# Patient Record
Sex: Female | Born: 1985 | Race: White | Hispanic: No | Marital: Single | Smoking: Current every day smoker
Health system: Southern US, Community
[De-identification: ages and names within clinical notes are randomized; demographics above are authoritative.]

## PROBLEM LIST (undated history)

## (undated) DIAGNOSIS — Z789 Other specified health status: Secondary | ICD-10-CM

## (undated) HISTORY — PX: DENTAL SURGERY: SHX609

---
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
## 2008-12-01 ENCOUNTER — Ambulatory Visit (HOSPITAL_COMMUNITY): Admission: RE | Admit: 2008-12-01 | Discharge: 2008-12-01 | Payer: Self-pay | Admitting: Family Medicine

## 2009-02-21 ENCOUNTER — Emergency Department (HOSPITAL_COMMUNITY): Admission: EM | Admit: 2009-02-21 | Discharge: 2009-02-21 | Payer: Self-pay | Admitting: Emergency Medicine

## 2010-04-21 LAB — PREGNANCY, URINE: Preg Test, Ur: NEGATIVE

## 2010-10-22 ENCOUNTER — Other Ambulatory Visit: Payer: Self-pay | Admitting: Nurse Practitioner

## 2010-10-22 ENCOUNTER — Other Ambulatory Visit (HOSPITAL_COMMUNITY)
Admission: RE | Admit: 2010-10-22 | Discharge: 2010-10-22 | Disposition: A | Discharge status: Home / Self Care | Payer: Self-pay | Source: Ambulatory Visit | Attending: Unknown Physician Specialty | Admitting: Unknown Physician Specialty

## 2010-10-22 ENCOUNTER — Other Ambulatory Visit (HOSPITAL_COMMUNITY)
Admission: RE | Admit: 2010-10-22 | Discharge: 2010-10-22 | Disposition: A | Discharge status: Home / Self Care | Payer: Self-pay | Source: Ambulatory Visit | Attending: Family Medicine | Admitting: Family Medicine

## 2010-10-22 DIAGNOSIS — N870 Mild cervical dysplasia: Secondary | ICD-10-CM | POA: Insufficient documentation

## 2010-10-22 DIAGNOSIS — R87613 High grade squamous intraepithelial lesion on cytologic smear of cervix (HGSIL): Secondary | ICD-10-CM | POA: Insufficient documentation

## 2010-12-16 ENCOUNTER — Encounter: Payer: Self-pay | Admitting: Emergency Medicine

## 2010-12-16 ENCOUNTER — Emergency Department (HOSPITAL_COMMUNITY): Payer: Self-pay

## 2010-12-16 ENCOUNTER — Emergency Department (HOSPITAL_COMMUNITY)
Admission: EM | Admit: 2010-12-16 | Discharge: 2010-12-16 | Disposition: A | Discharge status: Home / Self Care | Payer: Self-pay | Attending: Emergency Medicine | Admitting: Emergency Medicine

## 2010-12-16 DIAGNOSIS — M25519 Pain in unspecified shoulder: Secondary | ICD-10-CM | POA: Insufficient documentation

## 2010-12-16 DIAGNOSIS — R071 Chest pain on breathing: Secondary | ICD-10-CM | POA: Insufficient documentation

## 2010-12-16 DIAGNOSIS — F17200 Nicotine dependence, unspecified, uncomplicated: Secondary | ICD-10-CM | POA: Insufficient documentation

## 2010-12-16 DIAGNOSIS — R0789 Other chest pain: Secondary | ICD-10-CM

## 2010-12-16 MED ORDER — NAPROXEN 250 MG PO TABS
500.0000 mg | ORAL_TABLET | Freq: Once | ORAL | Status: AC
  Administered 2010-12-16: 500 mg via ORAL
  Filled 2010-12-16: qty 2

## 2010-12-16 NOTE — ED Notes (Signed)
C/o right upper chest pain radiating to right neck and shoulder off and on x 2 weeks; denies pain presently; states pain is sharp, worse with movement. A&ox4; in no distress.

## 2010-12-16 NOTE — ED Provider Notes (Signed)
Scribed for [employer]. Colon Branch, MD, the patient was seen in room APA05/APA05 . This chart was scribed by Ellie Lunch.   CSN: [id number] Arrival date & time: 12/16/2010  8:34 AM   First MD Initiated Contact with Patient 12/16/10 [phone]      Chief Complaint  Patient presents with  . Chest Pain  . Shoulder Pain    (Consider location/radiation/quality/duration/timing/severity/associated sxs/prior treatment) HPI Pt seen at 8:55 AM Kristen Bates is a 25 y.o. female who presents to the Emergency Department complaining of 2 weeks of intermittent sharp right sided chest pains. Pain described as a mild sharpness and radiates to right shoulder and back. At its worst, pain is associated with nausea. Pain is aggravated by movement, deep inspiration, and smoking. Nothing initiates pain. When sharp pain resolves, Pt reports some mild residual pain described as ache. Pt reports some  Improved by nothing. Pt has not treated pain with anything. Denies fever or chills. Pt currently reports no pain.    History reviewed. No pertinent past medical history.  Past Surgical History  Procedure Date  . Cesarean section     History reviewed. No pertinent family history.  History  Substance Use Topics  . Smoking status: Current Everyday Smoker -- 1.0 packs/day    Types: Cigarettes  . Smokeless tobacco: Not on file  . Alcohol Use: Yes    Review of Systems 10 Systems reviewed and are negative for acute change except as noted in the HPI.   Allergies  Review of patient's allergies indicates no known allergies.  Home Medications   Current Outpatient Rx  Name Route Sig Dispense Refill  . THERA M PLUS PO TABS Oral Take 1 tablet by mouth daily.      Marland Kitchen VITAMIN E 400 UNITS PO CAPS Oral Take 400 Units by mouth daily.        BP 120/74  Pulse 101  Temp(Src) 98.9 F (37.2 C) (Oral)  Resp 18  Ht 5\' 4"  (1.626 m)  Wt 172 lb (78.019 kg)  BMI 29.52 kg/m2  SpO2 99%  LMP 12/09/2010  Physical Exam    Nursing note and vitals reviewed. Constitutional: She is oriented to person, place, and time. She appears well-developed and well-nourished.  HENT:  Head: Normocephalic and atraumatic.  Eyes: EOM are normal. Pupils are equal, round, and reactive to light.  Neck: Neck supple.  Cardiovascular: Normal rate, regular rhythm and normal heart sounds.  Exam reveals no gallop and no friction rub.   No murmur heard. Pulmonary/Chest: Effort normal. No respiratory distress. She exhibits no tenderness.       Fine crackles right base Right sided chest pain not reproducible on palpation   Abdominal: Soft. There is no tenderness.  Neurological: She is alert and oriented to person, place, and time.  Skin: Skin is warm and dry.    ED Course  Procedures (including critical care time) OTHER DATA REVIEWED: Nursing notes, vital signs, and past medical records reviewed.   DIAGNOSTIC STUDIES: Oxygen Saturation is 99% on room air, normal by my interpretation.     Dg Chest 2 View  12/16/2010  *RADIOLOGY REPORT*  Clinical Data: Chest pain  CHEST - 2 VIEW  Comparison:  02/21/2009  Findings:  The heart size and mediastinal contours are within normal limits.  Both lungs are clear.  The visualized skeletal structures are unremarkable.  IMPRESSION: No active cardiopulmonary disease.  Original Report Authenticated By: Judie Petit. Ruel Favors, M.D.    ED MEDICATIONS  Medications  naproxen (NAPROSYN)  tablet 500 mg (not administered)    1. Chest wall pain     10:29 AM Pt recheck. EDP discussed unremarkable chest xray. Pain likely muscular in nature. Recommend Pt treat with antiinflammatory like aleve and heat.   MDM  Patient with 2 weeks history of intermit;tant sharp right sided chest pain. Some relief with occasional use of antiinflammatory. Chest xray negative. Responded in the ER to naprosyn. Pt feels improved after observation and/or treatment in ED.Pt stable in ED with no significant deterioration in  condition.The patient appears reasonably screened and/or stabilized for discharge and I doubt any other medical condition or other [facility] requiring further screening, evaluation, or treatment in the ED at this time prior to discharge.  MDM Reviewed: nursing note and vitals Interpretation: x-ray     I personally performed the services described in this documentation, which was scribed in my presence. The recorded information has been reviewed and considered.       Nicoletta Dress. Colon Branch, MD 12/16/10 1035

## 2010-12-16 NOTE — ED Notes (Signed)
Placed on continuous pulse oximetry monitoring-Spo2 99% RA

## 2010-12-16 NOTE — ED Notes (Signed)
Pt c/o chest pain x 2 weeks that radiates to her rt shoulder and neck and down her rt arm. Pt also c/o abd pain and diarrhea. Pt also left lower abd pain with nausea that patient states is chronic.

## 2011-04-17 ENCOUNTER — Emergency Department (HOSPITAL_COMMUNITY)
Admission: EM | Admit: 2011-04-17 | Discharge: 2011-04-17 | Disposition: A | Discharge status: Home / Self Care | Payer: Self-pay | Attending: Emergency Medicine | Admitting: Emergency Medicine

## 2011-04-17 ENCOUNTER — Encounter (HOSPITAL_COMMUNITY): Payer: Self-pay | Admitting: *Deleted

## 2011-04-17 DIAGNOSIS — F17200 Nicotine dependence, unspecified, uncomplicated: Secondary | ICD-10-CM | POA: Insufficient documentation

## 2011-04-17 DIAGNOSIS — L049 Acute lymphadenitis, unspecified: Secondary | ICD-10-CM | POA: Insufficient documentation

## 2011-04-17 MED ORDER — AMOXICILLIN 500 MG PO CAPS: 500.0000 mg | ORAL_CAPSULE | Freq: Three times a day (TID) | ORAL | Status: AC

## 2011-04-17 MED ORDER — IBUPROFEN 800 MG PO TABS: 800.0000 mg | ORAL_TABLET | Freq: Three times a day (TID) | ORAL | Status: AC

## 2011-04-17 NOTE — Discharge Instructions (Signed)
Cervical Adenitis  You have a swollen lymph gland in your neck. This commonly happens with Strep and virus infections, dental problems, insect bites, and injuries about the face, scalp, or neck. The lymph glands swell as the body fights the infection or heals the injury. Swelling and firmness typically lasts for several weeks after the infection or injury is healed. Rarely lymph glands can become swollen because of cancer or TB.  Antibiotics are prescribed if there is evidence of an infection. Sometimes an infected lymph gland becomes filled with pus. This condition may require opening up the abscessed gland by draining it surgically. Most of the time infected glands return to normal within two weeks. Do not poke or squeeze the swollen lymph nodes. That may keep them from shrinking back to their normal size. If the lymph gland is still swollen after 2 weeks, further medical evaluation is needed.    SEEK IMMEDIATE MEDICAL CARE IF:    You have difficulty swallowing or breathing, increased swelling, severe pain, or a high fever.    Document Released: 01/20/2005 Document Revised: 01/09/2011 Document Reviewed: 07/12/2006  [employer] Patient Information 2012 [employer].

## 2011-04-17 NOTE — ED Notes (Signed)
Pt states has had sore throat and ear ache x 6 days. Denies headache, fever and cough. No acute distress noted.  Throat is red slightly with no white patches noted. No c/o difficulty swallowing.

## 2011-04-17 NOTE — ED Notes (Signed)
Sore throat since Friday.  With pain into ear

## 2011-04-18 NOTE — ED Provider Notes (Signed)
History     CSN: [id number]  Arrival date & time 04/17/11  1137   First MD Initiated Contact with Patient 04/17/11 1249      Chief Complaint  Patient presents with  . Sore Throat    (Consider location/radiation/quality/duration/timing/severity/associated sxs/prior treatment) HPI Comments: Patient presents with a one-week history of pain and swelling of a lymph node in her neck, causing pain in her ear in her throat when she swallows.  Symptom has been present for one week.  She denies fevers or chills.  Patient is a 26 y.o. female presenting with pharyngitis. The history is provided by the patient.  Sore Throat This is a new problem. The current episode started in the past 7 days. The problem occurs constantly. The problem has been unchanged. Associated symptoms include neck pain and a sore throat. Pertinent negatives include no abdominal pain, arthralgias, chest pain, chills, congestion, coughing, fatigue, fever, headaches, joint swelling, nausea, numbness, rash or weakness. Exacerbated by: Palpation of her neck makes worse. She has tried acetaminophen for the symptoms. The treatment provided no relief.    History reviewed. No pertinent past medical history.  Past Surgical History  Procedure Date  . Cesarean section     Family History  Problem Relation Age of Onset  . Heart failure Mother   . Diabetes Father     History  Substance Use Topics  . Smoking status: Current Everyday Smoker -- 1.0 packs/day    Types: Cigarettes  . Smokeless tobacco: Not on file  . Alcohol Use: Yes    OB History    Grav Para Term Preterm Abortions TAB SAB Ect Mult Living                  Review of Systems  Constitutional: Negative for fever, chills and fatigue.  HENT: Positive for sore throat and neck pain. Negative for congestion and mouth sores.   Eyes: Negative.   Respiratory: Negative for cough, chest tightness and shortness of breath.   Cardiovascular: Negative for chest pain.    Gastrointestinal: Negative for nausea and abdominal pain.  Genitourinary: Negative.   Musculoskeletal: Negative for joint swelling and arthralgias.  Skin: Negative.  Negative for rash and wound.  Neurological: Negative for dizziness, weakness, light-headedness, numbness and headaches.  Hematological: Negative.   Psychiatric/Behavioral: Negative.     Allergies  Review of patient's allergies indicates no known allergies.  Home Medications   Current Outpatient Rx  Name Route Sig Dispense Refill  . AMOXICILLIN 500 MG PO CAPS Oral Take 1 capsule (500 mg total) by mouth 3 (three) times daily. 30 capsule 0  . IBUPROFEN 800 MG PO TABS Oral Take 1 tablet (800 mg total) by mouth 3 (three) times daily. 21 tablet 0  . THERA M PLUS PO TABS Oral Take 1 tablet by mouth daily.      Marland Kitchen VITAMIN E 400 UNITS PO CAPS Oral Take 400 Units by mouth daily.        BP 120/68  Pulse 84  Temp(Src) 97.8 F (36.6 C) (Oral)  Resp 18  Ht 5\' 4"  (1.626 m)  Wt 173 lb (78.472 kg)  BMI 29.70 kg/m2  SpO2 100%  LMP 04/07/2011  Physical Exam  Nursing note and vitals reviewed. Constitutional: She is oriented to person, place, and time. She appears well-developed and well-nourished.  HENT:  Head: Normocephalic and atraumatic.  Right Ear: Tympanic membrane and ear canal normal.  Left Ear: Tympanic membrane and ear canal normal.  Nose: No mucosal edema  or rhinorrhea.  Mouth/Throat: Uvula is midline, oropharynx is clear and moist and mucous membranes are normal. No oropharyngeal exudate, posterior oropharyngeal edema, posterior oropharyngeal erythema or tonsillar abscesses.  Eyes: Conjunctivae are normal.  Neck: Normal range of motion.  Cardiovascular: Normal rate, regular rhythm, normal heart sounds and intact distal pulses.   Pulmonary/Chest: Effort normal and breath sounds normal. She has no wheezes.  Abdominal: Soft. Bowel sounds are normal. There is no tenderness.  Musculoskeletal: Normal range of motion.   Lymphadenopathy:       Head (right side): Tonsillar adenopathy present.  Neurological: She is alert and oriented to person, place, and time.  Skin: Skin is warm and dry.  Psychiatric: She has a normal mood and affect.    ED Course  Procedures (including critical care time)  Labs Reviewed - No data to display No results found.   1. Lymphadenitis, acute       MDM  Patient prescribed amoxicillin and ibuprofen.  Suggested warm compresses and to avoid rubbing excessively on this lymph node.  Recheck by her PCP if not improved over the next 7-10 days.        Candis Musa, PA 04/18/11 [phone]

## 2011-04-18 NOTE — ED Provider Notes (Signed)
Medical screening examination/treatment/procedure(s) were performed by non-physician practitioner and as supervising physician I was immediately available for consultation/collaboration.   Geoffery Lyons, MD 04/18/11 [phone]

## 2011-11-29 ENCOUNTER — Encounter (HOSPITAL_COMMUNITY): Payer: Self-pay | Admitting: *Deleted

## 2011-11-29 ENCOUNTER — Emergency Department (HOSPITAL_COMMUNITY)
Admission: EM | Admit: 2011-11-29 | Discharge: 2011-11-29 | Disposition: A | Discharge status: Home / Self Care | Payer: Self-pay | Attending: Emergency Medicine | Admitting: Emergency Medicine

## 2011-11-29 DIAGNOSIS — K5289 Other specified noninfective gastroenteritis and colitis: Secondary | ICD-10-CM | POA: Insufficient documentation

## 2011-11-29 DIAGNOSIS — R112 Nausea with vomiting, unspecified: Secondary | ICD-10-CM | POA: Insufficient documentation

## 2011-11-29 DIAGNOSIS — R197 Diarrhea, unspecified: Secondary | ICD-10-CM | POA: Insufficient documentation

## 2011-11-29 DIAGNOSIS — R300 Dysuria: Secondary | ICD-10-CM | POA: Insufficient documentation

## 2011-11-29 DIAGNOSIS — F17200 Nicotine dependence, unspecified, uncomplicated: Secondary | ICD-10-CM | POA: Insufficient documentation

## 2011-11-29 DIAGNOSIS — K529 Noninfective gastroenteritis and colitis, unspecified: Secondary | ICD-10-CM

## 2011-11-29 DIAGNOSIS — Z79899 Other long term (current) drug therapy: Secondary | ICD-10-CM | POA: Insufficient documentation

## 2011-11-29 LAB — URINALYSIS, ROUTINE W REFLEX MICROSCOPIC
Glucose, UA: NEGATIVE mg/dL
Ketones, ur: NEGATIVE mg/dL
Protein, ur: NEGATIVE mg/dL
pH: 5.5 (ref 5.0–8.0)

## 2011-11-29 LAB — CBC WITH DIFFERENTIAL/PLATELET
Eosinophils Relative: 3 % (ref 0–5)
Lymphocytes Relative: 6 % — ABNORMAL LOW (ref 12–46)
Lymphs Abs: 1.2 10*3/uL (ref 0.7–4.0)
MCH: 31.8 pg (ref 26.0–34.0)
MCHC: 34.1 g/dL (ref 30.0–36.0)
MCV: 93.1 fL (ref 78.0–100.0)
Monocytes Absolute: 1.5 10*3/uL — ABNORMAL HIGH (ref 0.1–1.0)
Platelets: 300 10*3/uL (ref 150–400)
RBC: 5.07 MIL/uL (ref 3.87–5.11)
WBC: 20.4 10*3/uL — ABNORMAL HIGH (ref 4.0–10.5)

## 2011-11-29 LAB — HEPATIC FUNCTION PANEL
Bilirubin, Direct: 0.2 mg/dL (ref 0.0–0.3)
Total Bilirubin: 1.2 mg/dL (ref 0.3–1.2)

## 2011-11-29 LAB — BASIC METABOLIC PANEL
BUN: 16 mg/dL (ref 6–23)
Chloride: 101 mEq/L (ref 96–112)
Glucose, Bld: 126 mg/dL — ABNORMAL HIGH (ref 70–99)
Potassium: 4 mEq/L (ref 3.5–5.1)

## 2011-11-29 LAB — LIPASE, BLOOD: Lipase: 154 U/L — ABNORMAL HIGH (ref 11–59)

## 2011-11-29 LAB — URINE MICROSCOPIC-ADD ON

## 2011-11-29 MED ORDER — ONDANSETRON 4 MG PO TBDP: ORAL_TABLET | ORAL | Status: DC

## 2011-11-29 MED ORDER — SODIUM CHLORIDE 0.9 % IV SOLN
INTRAVENOUS | Status: DC
  Administered 2011-11-29: 11:00:00 via INTRAVENOUS

## 2011-11-29 MED ORDER — KETOROLAC TROMETHAMINE 30 MG/ML IJ SOLN
30.0000 mg | Freq: Once | INTRAMUSCULAR | Status: AC
  Administered 2011-11-29: 30 mg via INTRAVENOUS
  Filled 2011-11-29: qty 1

## 2011-11-29 MED ORDER — ONDANSETRON HCL 4 MG/2ML IJ SOLN
4.0000 mg | Freq: Once | INTRAMUSCULAR | Status: AC
  Administered 2011-11-29: 4 mg via INTRAVENOUS
  Filled 2011-11-29: qty 2

## 2011-11-29 NOTE — ED Notes (Signed)
N/v/d since 0745 this morning.  abd pain only with vomiting.

## 2011-11-29 NOTE — ED Provider Notes (Addendum)
History   This chart was scribed for Geoffery Lyons, MD scribed by Magnus Sinning. The patient was seen in room APA18/APA18 at 11:17   CSN: [id number]  Arrival date & time 11/29/11  1006   Chief Complaint  Patient presents with  . Emesis    (Consider location/radiation/quality/duration/timing/severity/associated sxs/prior treatment) The history is provided by the patient and a relative. No language interpreter was used.   Kristen Bates is a 26 y.o. female who presents to the Emergency Department complaining of constant emesis with associated nausea and diarrhea, onset this morning. Also reports associated dysuria that started two days ago. Patient says she has had emesis episodes every 20 minutes with severe dry heaving. She says her emesis has been green appearing and bile-like .  She denies fever, chills, or any abd surgeries besides her C-section performed approximately 7 years ago. Mother reports pt has hx of cervical cancer cells that physician recommended LEEP done,which she has not yet had performed. History reviewed. No pertinent past medical history.  Past Surgical History  Procedure Date  . Cesarean section     Family History  Problem Relation Age of Onset  . Heart failure Mother   . Diabetes Father     History  Substance Use Topics  . Smoking status: Current Every Day Smoker -- 1.0 packs/day    Types: Cigarettes  . Smokeless tobacco: Not on file  . Alcohol Use: Yes     occ    Review of Systems  Gastrointestinal: Positive for nausea, vomiting and diarrhea. Negative for abdominal pain.  Genitourinary: Positive for dysuria.  Musculoskeletal: Negative for back pain.  All other systems reviewed and are negative.    Allergies  Review of patient's allergies indicates no known allergies.  Home Medications   Current Outpatient Rx  Name Route Sig Dispense Refill  . THERA M PLUS PO TABS Oral Take 1 tablet by mouth daily.      Marland Kitchen VITAMIN E 400 UNITS PO CAPS Oral  Take 400 Units by mouth daily.        BP 127/80  Pulse 98  Temp 98.4 F (36.9 C) (Oral)  Resp 16  Ht 5\' 4"  (1.626 m)  Wt 191 lb (86.637 kg)  BMI 32.79 kg/m2  SpO2 97%  LMP 11/24/2011  Physical Exam  Nursing note and vitals reviewed. Constitutional: She is oriented to person, place, and time. She appears well-developed and well-nourished. No distress.  HENT:  Head: Normocephalic and atraumatic.  Eyes: Conjunctivae normal and EOM are normal. Pupils are equal, round, and reactive to light.  Neck: Normal range of motion. Neck supple. No tracheal deviation present.  Cardiovascular: Normal rate and regular rhythm.   Pulmonary/Chest: Effort normal. No respiratory distress. She has no wheezes. She has no rales.  Abdominal: Soft. Bowel sounds are normal. She exhibits no distension. There is no tenderness.  Musculoskeletal: Normal range of motion.  Neurological: She is alert and oriented to person, place, and time. No sensory deficit.  Skin: Skin is warm and dry.  Psychiatric: She has a normal mood and affect. Her behavior is normal.    ED Course  Procedures (including critical care time) DIAGNOSTIC STUDIES: Oxygen Saturation is 97% on room air, normal by my interpretation.    COORDINATION OF CARE: 11:17: Physical exam performed. Labs Reviewed  CBC WITH DIFFERENTIAL - Abnormal; Notable for the following:    WBC 20.4 (*)     Hemoglobin 16.1 (*)     HCT 47.2 (*)  Neutrophils Relative 84 (*)     Neutro Abs 17.1 (*)     Lymphocytes Relative 6 (*)     Monocytes Absolute 1.5 (*)     All other components within normal limits  BASIC METABOLIC PANEL - Abnormal; Notable for the following:    Glucose, Bld 126 (*)     All other components within normal limits  URINALYSIS, ROUTINE W REFLEX MICROSCOPIC - Abnormal; Notable for the following:    APPearance HAZY (*)     Specific Gravity, Urine >1.030 (*)     Hgb urine dipstick TRACE (*)     All other components within normal limits    URINE MICROSCOPIC-ADD ON - Abnormal; Notable for the following:    Squamous Epithelial / LPF MANY (*)     Bacteria, UA MANY (*)     All other components within normal limits  LIPASE, BLOOD - Abnormal; Notable for the following:    Lipase 154 (*)     All other components within normal limits  HEPATIC FUNCTION PANEL - Abnormal; Notable for the following:    Indirect Bilirubin 1.0 (*)     All other components within normal limits  PREGNANCY, URINE  URINE CULTURE   No results found.   No diagnosis found.    MDM  The patient presents here with n/v/d since this morning.  She denies any pain and the abd exam is benign.  She does have an elevated wbc and the lipase is slightly elevated, however she appears clinically well.  She has no pain and no ttp in the epigastrium that would suggest pancreatitis.  She feels better with zofran and fluids.  I will see about arranging an [place] for tomorrow morning to rule gallstones.    I personally performed the services described in this documentation, which was scribed in my presence. The recorded information has been reviewed and considered.          Geoffery Lyons, MD 11/29/11 1220  Geoffery Lyons, MD 11/29/11 [phone]

## 2011-11-30 ENCOUNTER — Other Ambulatory Visit (HOSPITAL_COMMUNITY): Payer: Self-pay | Admitting: Emergency Medicine

## 2011-11-30 ENCOUNTER — Ambulatory Visit (HOSPITAL_COMMUNITY)
Admit: 2011-11-30 | Discharge: 2011-11-30 | Disposition: A | Discharge status: Home / Self Care | Payer: Self-pay | Attending: Emergency Medicine | Admitting: Emergency Medicine

## 2011-11-30 DIAGNOSIS — R160 Hepatomegaly, not elsewhere classified: Secondary | ICD-10-CM | POA: Insufficient documentation

## 2011-11-30 DIAGNOSIS — R1011 Right upper quadrant pain: Secondary | ICD-10-CM

## 2011-11-30 LAB — URINE CULTURE
Colony Count: NO GROWTH
Culture: NO GROWTH

## 2012-10-17 ENCOUNTER — Emergency Department (HOSPITAL_COMMUNITY)
Admission: EM | Admit: 2012-10-17 | Discharge: 2012-10-17 | Disposition: A | Discharge status: Home / Self Care | Payer: BC Managed Care – PPO | Attending: Emergency Medicine | Admitting: Emergency Medicine

## 2012-10-17 ENCOUNTER — Encounter (HOSPITAL_COMMUNITY): Payer: Self-pay | Admitting: *Deleted

## 2012-10-17 DIAGNOSIS — K089 Disorder of teeth and supporting structures, unspecified: Secondary | ICD-10-CM | POA: Insufficient documentation

## 2012-10-17 DIAGNOSIS — K029 Dental caries, unspecified: Secondary | ICD-10-CM | POA: Insufficient documentation

## 2012-10-17 DIAGNOSIS — F17200 Nicotine dependence, unspecified, uncomplicated: Secondary | ICD-10-CM | POA: Insufficient documentation

## 2012-10-17 DIAGNOSIS — R599 Enlarged lymph nodes, unspecified: Secondary | ICD-10-CM | POA: Insufficient documentation

## 2012-10-17 MED ORDER — HYDROCODONE-ACETAMINOPHEN 5-325 MG PO TABS: 1.0000 | ORAL_TABLET | ORAL | Status: DC | PRN

## 2012-10-17 MED ORDER — HYDROCODONE-ACETAMINOPHEN 5-325 MG PO TABS
ORAL_TABLET | ORAL | Status: AC
  Administered 2012-10-17: 16:00:00
  Filled 2012-10-17: qty 1

## 2012-10-17 MED ORDER — NAPROXEN 375 MG PO TABS: 375.0000 mg | ORAL_TABLET | Freq: Two times a day (BID) | ORAL | Status: DC

## 2012-10-17 MED ORDER — AMOXICILLIN 500 MG PO CAPS: 500.0000 mg | ORAL_CAPSULE | Freq: Three times a day (TID) | ORAL | Status: DC

## 2012-10-17 NOTE — ED Notes (Signed)
Dental pain since Friday night.

## 2012-10-17 NOTE — ED Provider Notes (Signed)
CSN: [id number]     Arrival date & time 10/17/12  1524 History   First MD Initiated Contact with Patient 10/17/12 1551     Chief Complaint  Patient presents with  . Dental Pain   (Consider location/radiation/quality/duration/timing/severity/associated sxs/prior Treatment) Patient is a 27 y.o. female presenting with tooth pain. The history is provided by the patient.  Dental Pain Location:  Upper Upper teeth location:  11/LU cuspid Quality:  Throbbing Severity:  Severe Onset quality:  Gradual Duration:  2 days Timing:  Constant Progression:  Worsening Chronicity:  New Associated symptoms: no fever, no headaches and no neck pain    CANDIS KABEL is a 27 y.o. female who presents to the ED with dental pain that started 2 days ago. Patient states that she previously had an abscess at this tooth and then had a root canal. She has a temporary filling and was to return to have it finished but lost her insurance and never followed up. Now the area is painful and swollen.   History reviewed. No pertinent past medical history. Past Surgical History  Procedure Laterality Date  . Cesarean section     Family History  Problem Relation Age of Onset  . Heart failure Mother   . Diabetes Father    History  Substance Use Topics  . Smoking status: Current Every Day Smoker -- 1.00 packs/day    Types: Cigarettes  . Smokeless tobacco: Not on file  . Alcohol Use: Yes     Comment: occ   OB History   Grav Para Term Preterm Abortions TAB SAB Ect Mult Living                 Review of Systems  Constitutional: Negative for fever and chills.  HENT: Positive for dental problem. Negative for neck pain.   Gastrointestinal: Negative for nausea, vomiting and abdominal pain.  Musculoskeletal: Negative for myalgias.  Skin: Negative for rash.  Neurological: Negative for light-headedness and headaches.  Psychiatric/Behavioral: The patient is not nervous/anxious.     Allergies  Review of patient's  allergies indicates no known allergies.  Home Medications   Current Outpatient Rx  Name  Route  Sig  Dispense  Refill  . acetaminophen (TYLENOL) 500 MG tablet   Oral   Take 1,500 mg by mouth every 6 (six) hours as needed for pain.         Marland Kitchen levonorgestrel (MIRENA) 20 MCG/24HR IUD   Intrauterine   1 each by Intrauterine route once.          BP 118/67  Pulse 92  Temp(Src) 98.6 F (37 C) (Oral)  Resp 21  Ht 5\' 4"  (1.626 m)  Wt 202 lb (91.627 kg)  BMI 34.66 kg/m2  SpO2 100% Physical Exam  Nursing note and vitals reviewed. Constitutional: She is oriented to person, place, and time. She appears well-developed and well-nourished. No distress.  HENT:  Head: Normocephalic.  Mouth/Throat:    Eyes: EOM are normal.  Neck: Neck supple.  Cardiovascular: Normal rate and regular rhythm.   Pulmonary/Chest: Effort normal and breath sounds normal.  Abdominal: Soft. There is no tenderness.  Musculoskeletal: Normal range of motion.  Lymphadenopathy:    She has cervical adenopathy (left).  Neurological: She is alert and oriented to person, place, and time. No cranial nerve deficit.  Skin: Skin is warm and dry.  Psychiatric: She has a normal mood and affect. Her behavior is normal.    ED Course  Procedures  MDM  27 y.o.  female with dental pain. Will treat with antibiotics and pain management and she will follow up with the dental clinic.  Discussed with the patient and all questioned fully answered. She will return here if any problems arise .   Medication List    TAKE these medications       amoxicillin 500 MG capsule  Commonly known as:  AMOXIL  Take 1 capsule (500 mg total) by mouth 3 (three) times daily.     HYDROcodone-acetaminophen 5-325 MG per tablet  Commonly known as:  NORCO/VICODIN  Take 1 tablet by mouth every 4 (four) hours as needed.     naproxen 375 MG tablet  Commonly known as:  NAPROSYN  Take 1 tablet (375 mg total) by mouth 2 (two) times daily.        ASK your doctor about these medications       acetaminophen 500 MG tablet  Commonly known as:  TYLENOL  Take 1,500 mg by mouth every 6 (six) hours as needed for pain.     levonorgestrel 20 MCG/24HR IUD  Commonly known as:  MIRENA  1 each by Intrauterine route once.          Janne Napoleon, [place] 10/17/12 1705

## 2012-10-18 NOTE — ED Provider Notes (Signed)
Medical screening examination/treatment/procedure(s) were performed by non-physician practitioner and as supervising physician I was immediately available for consultation/collaboration.  Flint Melter, MD 10/18/12 [phone]

## 2013-09-14 ENCOUNTER — Emergency Department (HOSPITAL_COMMUNITY): Payer: Self-pay

## 2013-09-14 ENCOUNTER — Encounter (HOSPITAL_COMMUNITY): Payer: Self-pay | Admitting: Emergency Medicine

## 2013-09-14 ENCOUNTER — Emergency Department (HOSPITAL_COMMUNITY)
Admission: EM | Admit: 2013-09-14 | Discharge: 2013-09-14 | Disposition: A | Discharge status: Home / Self Care | Payer: Self-pay | Attending: Emergency Medicine | Admitting: Emergency Medicine

## 2013-09-14 DIAGNOSIS — R059 Cough, unspecified: Secondary | ICD-10-CM | POA: Insufficient documentation

## 2013-09-14 DIAGNOSIS — Z79899 Other long term (current) drug therapy: Secondary | ICD-10-CM | POA: Insufficient documentation

## 2013-09-14 DIAGNOSIS — R05 Cough: Secondary | ICD-10-CM | POA: Insufficient documentation

## 2013-09-14 DIAGNOSIS — Z792 Long term (current) use of antibiotics: Secondary | ICD-10-CM | POA: Insufficient documentation

## 2013-09-14 DIAGNOSIS — IMO0002 Reserved for concepts with insufficient information to code with codable children: Secondary | ICD-10-CM | POA: Insufficient documentation

## 2013-09-14 DIAGNOSIS — J209 Acute bronchitis, unspecified: Secondary | ICD-10-CM | POA: Insufficient documentation

## 2013-09-14 DIAGNOSIS — F17200 Nicotine dependence, unspecified, uncomplicated: Secondary | ICD-10-CM | POA: Insufficient documentation

## 2013-09-14 MED ORDER — AZITHROMYCIN 250 MG PO TABS: 250.0000 mg | ORAL_TABLET | Freq: Every day | ORAL | Status: DC

## 2013-09-14 MED ORDER — ALBUTEROL SULFATE HFA 108 (90 BASE) MCG/ACT IN AERS
2.0000 | INHALATION_SPRAY | Freq: Once | RESPIRATORY_TRACT | Status: AC
  Administered 2013-09-14: 2 via RESPIRATORY_TRACT
  Filled 2013-09-14: qty 6.7

## 2013-09-14 MED ORDER — PREDNISONE 10 MG PO TABS: ORAL_TABLET | ORAL | Status: DC

## 2013-09-14 MED ORDER — AZITHROMYCIN 250 MG PO TABS
500.0000 mg | ORAL_TABLET | Freq: Once | ORAL | Status: AC
  Administered 2013-09-14: 500 mg via ORAL
  Filled 2013-09-14: qty 2

## 2013-09-14 MED ORDER — PREDNISONE 50 MG PO TABS
60.0000 mg | ORAL_TABLET | Freq: Once | ORAL | Status: AC
  Administered 2013-09-14: 60 mg via ORAL
  Filled 2013-09-14 (×2): qty 1

## 2013-09-14 MED ORDER — BENZONATATE 100 MG PO CAPS: 200.0000 mg | ORAL_CAPSULE | Freq: Three times a day (TID) | ORAL | Status: DC | PRN

## 2013-09-14 NOTE — ED Notes (Signed)
Cold symptoms x1 week with no relief from otc  meds per pt. C/o cough as well.

## 2013-09-14 NOTE — ED Provider Notes (Signed)
CSN: [id number]     Arrival date & time 09/14/13  1436 History   First MD Initiated Contact with Patient 09/14/13 1527     Chief Complaint  Patient presents with  . URI     (Consider location/radiation/quality/duration/timing/severity/associated sxs/prior Treatment) The history is provided by the patient.   Kristen GermanyKatie M Bates is a 28 y.o. female presenting with a 1 week history of uri type symptoms which includes nasal congestion with clear rhinorrhea, post nasal drip, mild sore throat  and cough producing yellow sputum,  Sometimes blood streaked.  She states she has had blood streaked nasal drainage as well but no overt nose bleed.  Symptoms due to not include shortness of breath, chest pain,  Nausea, vomiting or diarrhea.  The patient has taken mucinex DM prior to arrival with no significant improvement in symptoms. She is a daily smoker.     History reviewed. No pertinent past medical history. Past Surgical History  Procedure Laterality Date  . Cesarean section     Family History  Problem Relation Age of Onset  . Heart failure Mother   . Diabetes Father    History  Substance Use Topics  . Smoking status: Current Every Day Smoker -- 1.00 packs/day    Types: Cigarettes  . Smokeless tobacco: Not on file  . Alcohol Use: Yes     Comment: occ   OB History   Grav Para Term Preterm Abortions TAB SAB Ect Mult Living                 Review of Systems  Constitutional: Negative for fever and chills.  HENT: Positive for congestion, postnasal drip, rhinorrhea and sore throat. Negative for ear pain.   Eyes: Negative.   Respiratory: Positive for cough and wheezing. Negative for chest tightness and shortness of breath.   Cardiovascular: Negative for chest pain.  Gastrointestinal: Negative for nausea and abdominal pain.  Genitourinary: Negative.   Musculoskeletal: Negative for arthralgias, joint swelling and neck pain.  Skin: Negative.  Negative for rash and wound.  Neurological:  Negative for dizziness, weakness, light-headedness, numbness and headaches.  Psychiatric/Behavioral: Negative.       Allergies  Review of patient's allergies indicates no known allergies.  Home Medications   Prior to Admission medications   Medication Sig Start Date End Date Taking? Authorizing Provider  dextromethorphan-guaiFENesin (MUCINEX DM) 30-600 MG per 12 hr tablet Take 1 tablet by mouth 2 (two) times daily as needed for cough.   Yes Historical Provider, MD  azithromycin (ZITHROMAX) 250 MG tablet Take 1 tablet (250 mg total) by mouth daily. Take one tablet daily starting on 09/15/13. 09/14/13   Burgess AmorJulie Sesilia Poucher, PA-C  benzonatate (TESSALON) 100 MG capsule Take 2 capsules (200 mg total) by mouth 3 (three) times daily as needed for cough. 09/14/13   Burgess AmorJulie Unique Searfoss, PA-C  levonorgestrel (MIRENA) 20 MCG/24HR IUD 1 each by Intrauterine route once.    Historical Provider, MD  predniSONE (DELTASONE) 10 MG tablet 6, 5, 4, 3, 2 then 1 tablet by mouth daily for 6 days total. 09/15/13   Burgess AmorJulie Trevell Pariseau, PA-C   BP 125/62  Pulse 74  Temp(Src) 98.7 F (37.1 C) (Oral)  Resp 16  Wt 188 lb (85.276 kg)  SpO2 100% Physical Exam  Constitutional: She is oriented to person, place, and time. She appears well-developed and well-nourished.  HENT:  Head: Normocephalic and atraumatic.  Right Ear: Tympanic membrane and ear canal normal.  Left Ear: Tympanic membrane and ear canal normal.  Nose:  Rhinorrhea present. No mucosal edema.  Mouth/Throat: Uvula is midline, oropharynx is clear and moist and mucous membranes are normal. No oropharyngeal exudate, posterior oropharyngeal edema, posterior oropharyngeal erythema or tonsillar abscesses.  Eyes: Conjunctivae are normal.  Cardiovascular: Normal rate and normal heart sounds.   Pulmonary/Chest: Effort normal. No respiratory distress. She has wheezes in the left middle field and the left lower field. She has no rales.  Faint expiratory wheeze left mid to lower lung which  clears with cough.  Abdominal: Soft. There is no tenderness.  Musculoskeletal: Normal range of motion.  Neurological: She is alert and oriented to person, place, and time.  Skin: Skin is warm and dry. No rash noted.  Psychiatric: She has a normal mood and affect.    ED Course  Procedures (including critical care time) Labs Review Labs Reviewed - No data to display  Imaging Review Dg Chest 2 View  09/14/2013   CLINICAL DATA:  Cough and congestion.  EXAM: CHEST  2 VIEW  COMPARISON:  12/16/2010  FINDINGS: The heart size and mediastinal contours are within normal limits. Both lungs are clear. The visualized skeletal structures are unremarkable.  IMPRESSION: Normal chest radiographs   Electronically Signed   By: Amie Portland M.D.   On: 09/14/2013 15:24     EKG Interpretation None      MDM   Final diagnoses:  Bronchitis with bronchospasm    Patients labs and/or radiological studies were viewed and considered during the medical decision making and disposition process. Acute bronchitis.  Pt is stable with normal VS.  No history suggesting PE.  She is prescribed zithromax, prednisone, tessalon.  She was given an albuterol mdi with spacer.  Instructed to return here if she develops any worsened sx, sob, fever. Rest,  Increase fluid intake.Marland Kitchen  Discussed smoking cessation.     Burgess Amor, PA-C 09/14/13 1824

## 2013-09-14 NOTE — Discharge Instructions (Signed)
Acute Bronchitis Bronchitis is inflammation of the airways that extend from the windpipe into the lungs (bronchi). The inflammation often causes mucus to develop. This leads to a cough, which is the most common symptom of bronchitis.  In acute bronchitis, the condition usually develops suddenly and goes away over time, usually in a couple weeks. Smoking, allergies, and asthma can make bronchitis worse. Repeated episodes of bronchitis may cause further lung problems.  CAUSES Acute bronchitis is most often caused by the same virus that causes a cold. The virus can spread from person to person (contagious) through coughing, sneezing, and touching contaminated objects. SIGNS AND SYMPTOMS   Cough.   Fever.   Coughing up mucus.   Body aches.   Chest congestion.   Chills.   Shortness of breath.   Sore throat.  DIAGNOSIS  Acute bronchitis is usually diagnosed through a physical exam. Your health care provider will also ask you questions about your medical history. Tests, such as chest X-rays, are sometimes done to rule out other conditions.  TREATMENT  Acute bronchitis usually goes away in a couple weeks. Oftentimes, no medical treatment is necessary. Medicines are sometimes given for relief of fever or cough. Antibiotic medicines are usually not needed but may be prescribed in certain situations. In some cases, an inhaler may be recommended to help reduce shortness of breath and control the cough. A cool mist vaporizer may also be used to help thin bronchial secretions and make it easier to clear the chest.  HOME CARE INSTRUCTIONS  Get plenty of rest.   Drink enough fluids to keep your urine clear or pale yellow (unless you have a medical condition that requires fluid restriction). Increasing fluids may help thin your respiratory secretions (sputum) and reduce chest congestion, and it will prevent dehydration.   Take medicines only as directed by your health care provider.  If  you were prescribed an antibiotic medicine, finish it all even if you start to feel better.  Avoid smoking and secondhand smoke. Exposure to cigarette smoke or irritating chemicals will make bronchitis worse. If you are a smoker, consider using nicotine gum or skin patches to help control withdrawal symptoms. Quitting smoking will help your lungs heal faster.   Reduce the chances of another bout of acute bronchitis by washing your hands frequently, avoiding people with cold symptoms, and trying not to touch your hands to your mouth, nose, or eyes.   Keep all follow-up visits as directed by your health care provider.  SEEK MEDICAL CARE IF: Your symptoms do not improve after 1 week of treatment.  SEEK IMMEDIATE MEDICAL CARE IF:  You develop an increased fever or chills.   You have chest pain.   You have severe shortness of breath.  You have bloody sputum.   You develop dehydration.  You faint or repeatedly feel like you are going to pass out.  You develop repeated vomiting.  You develop a severe headache. MAKE SURE YOU:   Understand these instructions.  Will watch your condition.  Will get help right away if you are not doing well or get worse. Document Released: 02/28/2004 Document Revised: 06/06/2013 Document Reviewed: 07/13/2012 [facility] Patient Information 2015 [place], [place]. This information is not intended to replace advice given to you by your health care provider. Make sure you discuss any questions you have with your health care provider.  Take your next dose of Zithromax and prednisone tomorrow.  Use 2 puffs of your albuterol inhaler every 4 hours if you are  wheezing or coughing.  You may also use the Tessalon prescription if needed for additional cough relief.

## 2013-09-15 NOTE — ED Provider Notes (Signed)
Medical screening examination/treatment/procedure(s) were performed by non-physician practitioner and as supervising physician I was immediately available for consultation/collaboration.   EKG Interpretation None        Sherrol Vicars L Kennon Encinas, MD 09/15/13 1928 

## 2018-09-25 ENCOUNTER — Emergency Department (HOSPITAL_COMMUNITY): Payer: Self-pay

## 2018-09-25 ENCOUNTER — Encounter (HOSPITAL_COMMUNITY): Payer: Self-pay

## 2018-09-25 ENCOUNTER — Emergency Department (HOSPITAL_COMMUNITY)
Admission: EM | Admit: 2018-09-25 | Discharge: 2018-09-26 | Disposition: A | Discharge status: Home / Self Care | Payer: Self-pay | Attending: Emergency Medicine | Admitting: Emergency Medicine

## 2018-09-25 ENCOUNTER — Other Ambulatory Visit: Payer: Self-pay

## 2018-09-25 DIAGNOSIS — F17210 Nicotine dependence, cigarettes, uncomplicated: Secondary | ICD-10-CM | POA: Insufficient documentation

## 2018-09-25 DIAGNOSIS — N3001 Acute cystitis with hematuria: Secondary | ICD-10-CM

## 2018-09-25 DIAGNOSIS — R52 Pain, unspecified: Secondary | ICD-10-CM

## 2018-09-25 DIAGNOSIS — R1013 Epigastric pain: Secondary | ICD-10-CM

## 2018-09-25 LAB — URINALYSIS, ROUTINE W REFLEX MICROSCOPIC
Bilirubin Urine: NEGATIVE
Glucose, UA: NEGATIVE mg/dL
Ketones, ur: 20 mg/dL — AB
Leukocytes,Ua: NEGATIVE
Nitrite: NEGATIVE
Protein, ur: NEGATIVE mg/dL
Specific Gravity, Urine: 1.014 (ref 1.005–1.030)
pH: 6 (ref 5.0–8.0)

## 2018-09-25 LAB — CBC
HCT: 39.8 % (ref 36.0–46.0)
Hemoglobin: 13.2 g/dL (ref 12.0–15.0)
MCH: 33.2 pg (ref 26.0–34.0)
MCHC: 33.2 g/dL (ref 30.0–36.0)
MCV: 100 fL (ref 80.0–100.0)
Platelets: 221 10*3/uL (ref 150–400)
RBC: 3.98 MIL/uL (ref 3.87–5.11)
RDW: 12.6 % (ref 11.5–15.5)
WBC: 10.4 10*3/uL (ref 4.0–10.5)
nRBC: 0 % (ref 0.0–0.2)

## 2018-09-25 LAB — COMPREHENSIVE METABOLIC PANEL
ALT: 12 U/L (ref 0–44)
AST: 16 U/L (ref 15–41)
Albumin: 4 g/dL (ref 3.5–5.0)
Alkaline Phosphatase: 55 U/L (ref 38–126)
Anion gap: 9 (ref 5–15)
BUN: 6 mg/dL (ref 6–20)
CO2: 26 mmol/L (ref 22–32)
Calcium: 8.7 mg/dL — ABNORMAL LOW (ref 8.9–10.3)
Chloride: 104 mmol/L (ref 98–111)
Creatinine, Ser: 0.7 mg/dL (ref 0.44–1.00)
GFR calc Af Amer: >60 mL/min (ref >60)
GFR calc non Af Amer: >60 mL/min (ref >60)
Glucose, Bld: 130 mg/dL — ABNORMAL HIGH (ref 70–99)
Potassium: 3.2 mmol/L — ABNORMAL LOW (ref 3.5–5.1)
Sodium: 139 mmol/L (ref 135–145)
Total Bilirubin: 1.2 mg/dL (ref 0.3–1.2)
Total Protein: 7.2 g/dL (ref 6.5–8.1)

## 2018-09-25 LAB — POC URINE PREG, ED: Preg Test, Ur: NEGATIVE

## 2018-09-25 LAB — LIPASE, BLOOD: Lipase: 21 U/L (ref 11–51)

## 2018-09-25 MED ORDER — OMEPRAZOLE 20 MG PO CPDR: 20.0000 mg | DELAYED_RELEASE_CAPSULE | Freq: Every day | ORAL | 0 refills | Status: DC

## 2018-09-25 MED ORDER — SODIUM CHLORIDE 0.9 % IV BOLUS
500.0000 mL | Freq: Once | INTRAVENOUS | Status: AC
  Administered 2018-09-25: 23:00:00 500 mL via INTRAVENOUS

## 2018-09-25 MED ORDER — ONDANSETRON HCL 4 MG/2ML IJ SOLN
4.0000 mg | Freq: Once | INTRAMUSCULAR | Status: AC
  Administered 2018-09-25: 4 mg via INTRAVENOUS
  Filled 2018-09-25: qty 2

## 2018-09-25 MED ORDER — SODIUM CHLORIDE 0.9 % IV SOLN
INTRAVENOUS | Status: DC
  Administered 2018-09-25: 23:00:00 via INTRAVENOUS

## 2018-09-25 MED ORDER — IOHEXOL 300 MG/ML  SOLN
100.0000 mL | Freq: Once | INTRAMUSCULAR | Status: AC | PRN
  Administered 2018-09-25: 23:00:00 100 mL via INTRAVENOUS

## 2018-09-25 MED ORDER — CEPHALEXIN 500 MG PO CAPS: 500.0000 mg | ORAL_CAPSULE | Freq: Four times a day (QID) | ORAL | 0 refills | Status: DC

## 2018-09-25 MED ORDER — SODIUM CHLORIDE 0.9 % IV SOLN
1.0000 g | Freq: Once | INTRAVENOUS | Status: AC
  Administered 2018-09-25: 23:00:00 1 g via INTRAVENOUS
  Filled 2018-09-25: qty 10

## 2018-09-25 MED ORDER — HYDROMORPHONE HCL 1 MG/ML IJ SOLN
0.5000 mg | Freq: Once | INTRAMUSCULAR | Status: AC
  Administered 2018-09-25: 23:00:00 0.5 mg via INTRAVENOUS
  Filled 2018-09-25: qty 1

## 2018-09-25 MED ORDER — ONDANSETRON 4 MG PO TBDP: 4.0000 mg | ORAL_TABLET | Freq: Three times a day (TID) | ORAL | 1 refills | Status: DC | PRN

## 2018-09-25 MED ORDER — PANTOPRAZOLE SODIUM 40 MG IV SOLR
40.0000 mg | Freq: Once | INTRAVENOUS | Status: AC
  Administered 2018-09-25: 23:00:00 40 mg via INTRAVENOUS
  Filled 2018-09-25: qty 40

## 2018-09-25 MED ORDER — SODIUM CHLORIDE 0.9% FLUSH
3.0000 mL | Freq: Once | INTRAVENOUS | Status: AC
  Administered 2018-09-25: 3 mL via INTRAVENOUS

## 2018-09-25 NOTE — ED Triage Notes (Signed)
Pt believes she has  UTI. Is now having flank pain as well. States she is having  GI issues as well, but can't see a doctor. Has not had a solid stool in 2 months. Has thrown up twice today.

## 2018-09-25 NOTE — ED Provider Notes (Signed)
[facility] Provider Note   CSN: [id number] Arrival date & time: 09/25/18  1712     History   Chief Complaint Chief Complaint  Patient presents with  . Abdominal Pain    HPI Kristen Bates is a 33 y.o. female.     Patient presenting with 2 concerns.  First is he feels as if she has a urinary tract infection.  Is felt that way for several days.  She has had at least 2 months of sort of generalized abdominal pain epigastric abdominal pain.  Kind of a burning sensation.  Patient concerned about may be having a stomach ulcer.  I guess gallbladder would be a possibility as well.  Patient states that shortly after she eats food she can gets the runs.  Patient day started with a little bit discomfort in the right flank right lower quadrant area.  And just she threw up twice today.  Threw up once yesterday.  Yesterday had some streaking of blood.  But there is been no pooling of blood.  No blood in her bowel movements.  Patient has an IUD in place.  But pregnancy is a possibility.  Patient did have some sweats early in the morning.  But no true fevers.  No upper respiratory symptoms.     History reviewed. No pertinent past medical history.  There are no active problems to display for this patient.   Past Surgical History:  Procedure Laterality Date  . CESAREAN SECTION    . DENTAL SURGERY       OB History   No obstetric history on file.      Home Medications    Prior to Admission medications   Medication Sig Start Date End Date Taking? Authorizing Provider  azithromycin (ZITHROMAX) 250 MG tablet Take 1 tablet (250 mg total) by mouth daily. Take one tablet daily starting on 09/15/13. 09/14/13   Burgess AmorIdol, Julie, PA-C  benzonatate (TESSALON) 100 MG capsule Take 2 capsules (200 mg total) by mouth 3 (three) times daily as needed for cough. 09/14/13   Burgess AmorIdol, Julie, PA-C  cephALEXin (KEFLEX) 500 MG capsule Take 1 capsule (500 mg total) by mouth 4 (four) times daily.  09/25/18   Vanetta MuldersZackowski, Hodges Treiber, MD  dextromethorphan-guaiFENesin [facility](MUCINEX DM) 30-600 MG per 12 hr tablet Take 1 tablet by mouth 2 (two) times daily as needed for cough.    [provider]  levonorgestrel (MIRENA) 20 MCG/24HR IUD 1 each by Intrauterine route once.    [provider]  omeprazole (PRILOSEC) 20 MG capsule Take 1 capsule (20 mg total) by mouth daily. 09/25/18   Vanetta MuldersZackowski, Wynton Hufstetler, MD  ondansetron (ZOFRAN ODT) 4 MG disintegrating tablet Take 1 tablet (4 mg total) by mouth every 8 (eight) hours as needed. 09/25/18   Vanetta MuldersZackowski, Ashlee Player, MD  predniSONE (DELTASONE) 10 MG tablet 6, 5, 4, 3, 2 then 1 tablet by mouth daily for 6 days total. 09/15/13   Burgess AmorIdol, Julie, PA-C    Family History Family History  Problem Relation Age of Onset  . Heart failure Mother   . Diabetes Father     Social History Social History   Tobacco Use  . Smoking status: Current Every Day Smoker    Packs/day: 1.00    Types: Cigarettes  . Smokeless tobacco: Never Used  Substance Use Topics  . Alcohol use: Yes    Comment: occ  . Drug use: No     Allergies   Patient has no known allergies.   Review of  Systems Review of Systems  Constitutional: Positive for diaphoresis. Negative for chills and fever.  HENT: Negative for congestion, rhinorrhea and sore throat.   Eyes: Negative for visual disturbance.  Respiratory: Negative for cough and shortness of breath.   Cardiovascular: Negative for chest pain and leg swelling.  Gastrointestinal: Positive for abdominal pain, nausea and vomiting. Negative for diarrhea.  Genitourinary: Positive for dysuria.  Musculoskeletal: Negative for back pain and neck pain.  Skin: Negative for rash.  Neurological: Negative for dizziness, light-headedness and headaches.  Hematological: Does not bruise/bleed easily.  Psychiatric/Behavioral: Negative for confusion.     Physical Exam Updated Vital Signs BP (!) 148/95 (BP Location: Right Arm)   Pulse 93   Temp 98.8  F (37.1 C) (Oral)   Resp 14   Ht 1.626 m (5\' 4" )   Wt 112 kg   SpO2 99%   BMI 42.40 kg/m   Physical Exam Vitals signs and nursing note reviewed.  Constitutional:      General: She is not in acute distress.    Appearance: She is well-developed.  HENT:     Head: Normocephalic and atraumatic.  Eyes:     Extraocular Movements: Extraocular movements intact.     Conjunctiva/sclera: Conjunctivae normal.     Pupils: Pupils are equal, round, and reactive to light.  Neck:     Musculoskeletal: Normal range of motion and neck supple.  Cardiovascular:     Rate and Rhythm: Normal rate and regular rhythm.     Heart sounds: No murmur.  Pulmonary:     Effort: Pulmonary effort is normal. No respiratory distress.     Breath sounds: Normal breath sounds.  Abdominal:     General: There is no distension.     Palpations: Abdomen is soft. There is no mass.     Tenderness: There is no abdominal tenderness. There is no guarding.     Hernia: No hernia is present.  Musculoskeletal: Normal range of motion.  Skin:    General: Skin is warm and dry.  Neurological:     General: No focal deficit present.     Mental Status: She is alert and oriented to person, place, and time.      ED Treatments / Results  Labs (all labs ordered are listed, but only abnormal results are displayed) Labs Reviewed  COMPREHENSIVE METABOLIC PANEL - Abnormal; Notable for the following components:      Result Value   Potassium 3.2 (*)    Glucose, Bld 130 (*)    Calcium 8.7 (*)    All other components within normal limits  URINALYSIS, ROUTINE W REFLEX MICROSCOPIC - Abnormal; Notable for the following components:   Hgb urine dipstick MODERATE (*)    Ketones, ur 20 (*)    Bacteria, UA RARE (*)    All other components within normal limits  URINE CULTURE  LIPASE, BLOOD  CBC  POC URINE PREG, ED    EKG None  Radiology No results found.  Procedures Procedures (including critical care time)  Medications  Ordered in ED Medications  sodium chloride flush (NS) 0.9 % injection 3 mL (has no administration in time range)  0.9 %  sodium chloride infusion ( Intravenous New Bag/Given 09/25/18 2308)  pantoprazole (PROTONIX) injection 40 mg (has no administration in time range)  cefTRIAXone (ROCEPHIN) 1 g in sodium chloride 0.9 % 100 mL IVPB (has no administration in time range)  sodium chloride 0.9 % bolus 500 mL (500 mLs Intravenous New Bag/Given 09/25/18 2310)  ondansetron (  ZOFRAN) injection 4 mg (4 mg Intravenous Given 09/25/18 2310)  HYDROmorphone (DILAUDID) injection 0.5 mg (0.5 mg Intravenous Given 09/25/18 2311)     Initial Impression / Assessment and Plan / ED Course  I have reviewed the triage vital signs and the nursing notes.  Pertinent labs & imaging results that were available during my care of the patient were reviewed by me and considered in my medical decision making (see chart for details).      Labs without any significant abnormalities.  Mild hypokalemia.  Liver function tests normal. No leukocytosis.  Pregnancy test negative.  Urinalysis is consistent with urinary tract infection.  Patient symptoms and she currently does not have a primary care doctor to follow-up with could represent gallbladder problems could represent peptic ulcer disease.  Based on these concerns patient will have CT scan of abdomen done tonight.  Urine sent for culture.  We treated with Keflex for the urinary tract infection.  Patient given a dose of Rocephin here.  Disposition we based on CT scan findings.   Final Clinical Impressions(s) / ED Diagnoses   Final diagnoses:  Epigastric pain  Acute cystitis with hematuria    ED Discharge Orders         Ordered    cephALEXin (KEFLEX) 500 MG capsule  4 times daily     09/25/18 2311    omeprazole (PRILOSEC) 20 MG capsule  Daily     09/25/18 2311    ondansetron (ZOFRAN ODT) 4 MG disintegrating tablet  Every 8 hours PRN     09/25/18 2311            Vanetta MuldersZackowski, Pearlina Friedly, MD 09/25/18 2316

## 2018-09-25 NOTE — Discharge Instructions (Addendum)
Take the Keflex for the urinary tract infection.  Would expect some improvement for that in a couple days.  Urine was sent for culture.  Take the Prilosec for the epigastric abdominal pain. Return to the ED if you develop new or worsening symptoms.

## 2018-09-26 ENCOUNTER — Emergency Department (HOSPITAL_COMMUNITY): Payer: Self-pay

## 2018-09-26 MED ORDER — SODIUM CHLORIDE 0.9 % IV BOLUS
1000.0000 mL | Freq: Once | INTRAVENOUS | Status: AC
  Administered 2018-09-26: 01:00:00 1000 mL via INTRAVENOUS

## 2018-09-26 MED ORDER — FENTANYL CITRATE (PF) 100 MCG/2ML IJ SOLN
50.0000 ug | Freq: Once | INTRAMUSCULAR | Status: AC
  Administered 2018-09-26: 02:00:00 50 ug via INTRAVENOUS
  Filled 2018-09-26: qty 2

## 2018-09-26 NOTE — ED Provider Notes (Signed)
Care assumed from Dr. Rogene Houston.  Patient here with epigastric burning as well as right-sided flank pain and dysuria.  She is awaiting CT scan.  Patient with normal LFTs and lipase.  Pregnancy test negative.  Does have UTI.  CT scan shows normal-appearing gallbladder.  Does show enhancement of right kidney with some perinephric stranding.  Will treat for suspected pyelonephritis.  No vomiting on recheck.  Patient tolerating p.o.  She does have significant right-sided pelvic pain on reexam.  Will proceed with ultrasound to rule out ovarian torsion.  Ultrasound is negative for ovarian torsion or other acute pathology.  Patient tolerating p.o. and ambulatory.  We will treat for suspected pyelonephritis with antibiotics and symptom control.  Follow-up with PCP.  Return precautions discussed  BP 130/81   Pulse 86   Temp 98.8 F (37.1 C) (Oral)   Resp 14   Ht 5\' 4"  (1.626 m)   Wt 112 kg   SpO2 100%   BMI 42.40 kg/m     Ezequiel Essex, MD 09/26/18 [phone]

## 2018-09-26 NOTE — ED Notes (Signed)
Patient transported to Ultrasound 

## 2018-09-26 NOTE — ED Notes (Signed)
Pt given sprite 

## 2018-09-28 LAB — URINE CULTURE: Culture: >=100000 — AB

## 2018-09-29 ENCOUNTER — Telehealth: Payer: Self-pay | Admitting: Emergency Medicine

## 2018-09-29 NOTE — Telephone Encounter (Signed)
Post ED Visit - Positive Culture Follow-up  Culture report reviewed by antimicrobial stewardship pharmacist: [place] Team []  Elenor Quinones, Pharm.D. []  Heide Guile, Pharm.D., BCPS AQ-ID []  Parks Neptune, Pharm.D., BCPS []  Alycia Rossetti, Pharm.D., BCPS []  [place], [place].D., BCPS, AAHIVP []  Legrand Como, Pharm.D., BCPS, AAHIVP []  Salome Arnt, PharmD, BCPS []  Johnnette Gourd, PharmD, BCPS []  Hughes Better, PharmD, BCPS []  Leeroy Cha, PharmD []  Laqueta Linden, PharmD, BCPS []  Albertina Parr, PharmD Margaretmary Bayley PharmD  [place] Team []  Leodis Sias, PharmD []  Lindell Spar, PharmD []  Royetta Asal, PharmD []  Graylin Shiver, Rph []  Rema Fendt) Glennon Mac, PharmD []  Arlyn Dunning, PharmD []  Netta Cedars, PharmD []  Dia Sitter, PharmD []  Leone Haven, PharmD []  Gretta Arab, PharmD []  Theodis Shove, PharmD []  Peggyann Juba, PharmD []  Reuel Boom, PharmD   Positive urine culture Treated with cephalexin, organism sensitive to the same and no further patient follow-up is required at this time.  Hazle Nordmann 09/29/2018, 9:57 AM

## 2019-02-08 ENCOUNTER — Other Ambulatory Visit: Payer: Self-pay

## 2019-02-08 ENCOUNTER — Ambulatory Visit: Payer: Medicaid Other | Attending: Internal Medicine

## 2019-02-08 DIAGNOSIS — Z20822 Contact with and (suspected) exposure to covid-19: Secondary | ICD-10-CM

## 2019-02-10 ENCOUNTER — Telehealth: Payer: Self-pay

## 2019-02-10 LAB — NOVEL CORONAVIRUS, NAA: SARS-CoV-2, NAA: DETECTED — AB

## 2019-02-10 NOTE — Telephone Encounter (Signed)
Pt call for test results. Informed her that a nurse would return her call. She voices understanding.   [facility]

## 2019-02-10 NOTE — Telephone Encounter (Signed)
Attempted to contact patient twice no answer and vm ws left for patient to callback

## 2019-02-10 NOTE — Telephone Encounter (Signed)
Pt notified of positive COVID-19 test results. Pt verbalized understanding. Pt reports that she has fatigue and had fever.Pt advised to remain in self quarantine until at least 10 days since symptom onset And 3 consecutive days fever free without antipyretics And improvement in respiratory symptoms. Patient advised to utilize over the counter medications to treat symptoms. Pt advised to seek treatment in the ED if respiratory issues/distress develops.Pt advised they should only leave home to seek and medical care and must wear a mask in public. Pt instructed to limit contact with family members or caregivers in the home. Pt advised to practice social distancing and to continue to use good preventative care measures such has frequent hand washing, staying out of crowds and cleaning hard surfaces frequently touched in the home.Pt informed that the health department will likely follow up and may have additional recommendations.

## 2020-03-25 IMAGING — CT CT ABDOMEN AND PELVIS WITH CONTRAST
2 of 4 series · 16 of 46 positions shown, 18 images · IV contrast (Isovue)
Comparison: None.

CLINICAL DATA: Abdominal pain.

EXAM:
CT ABDOMEN AND PELVIS WITH CONTRAST
TECHNIQUE: Multidetector CT imaging of the abdomen and pelvis was performed
using the standard protocol following bolus administration of
intravenous contrast.
CONTRAST:  100mL OMNIPAQUE IOHEXOL 300 MG/ML  SOLN

[Series 2: axial st · axial · 0.81mm/px · z∈[-690,-270]mm · 13 of 96 slices shown, 15 images]
[im 6/96  soft-tissue]
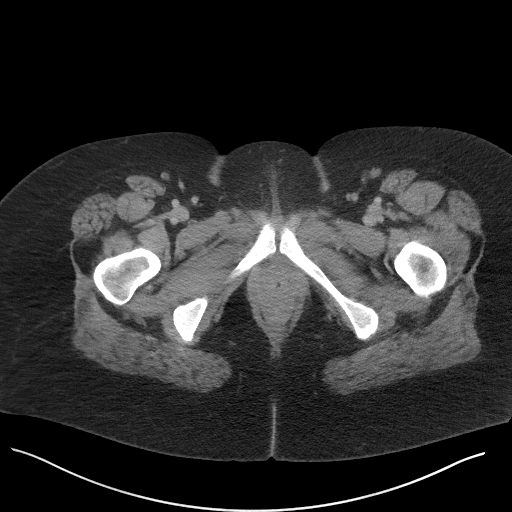
[im 6/96  bone]
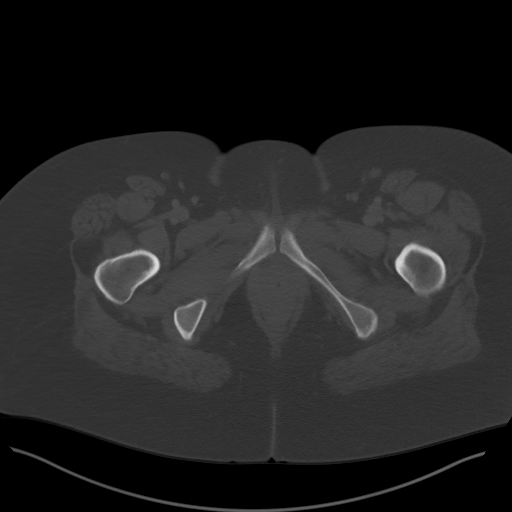
[im 11/96  soft-tissue]
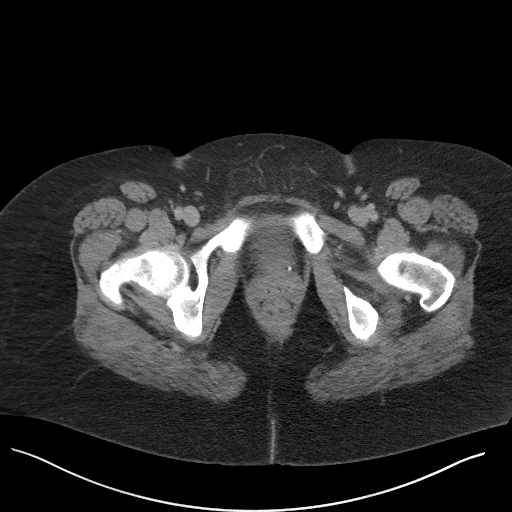
[im 22/96  soft-tissue]
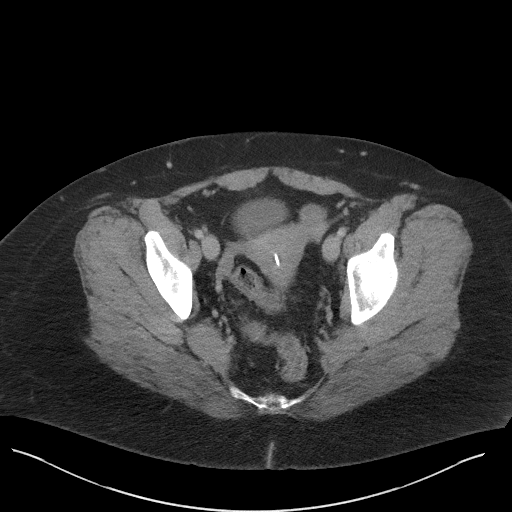
[im 27/96  soft-tissue]
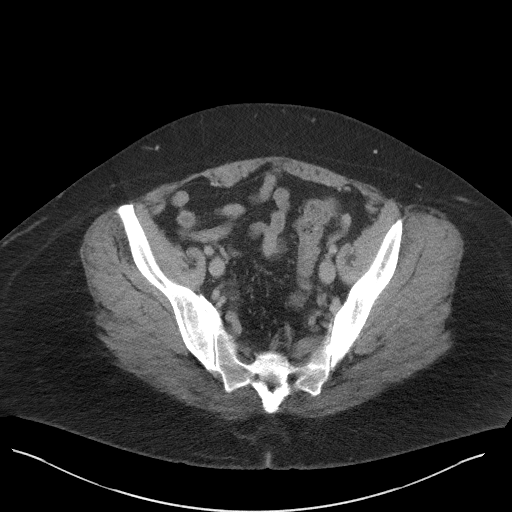
[im 32/96  soft-tissue]
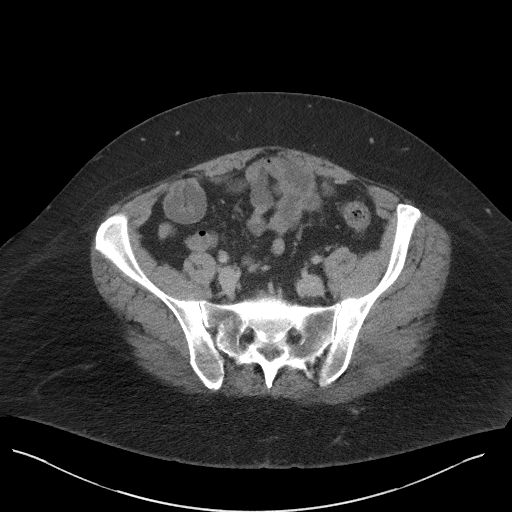
[im 43/96  soft-tissue]
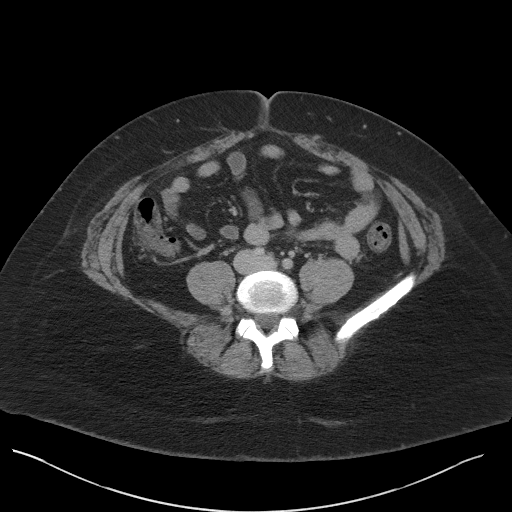
[im 48/96  soft-tissue]
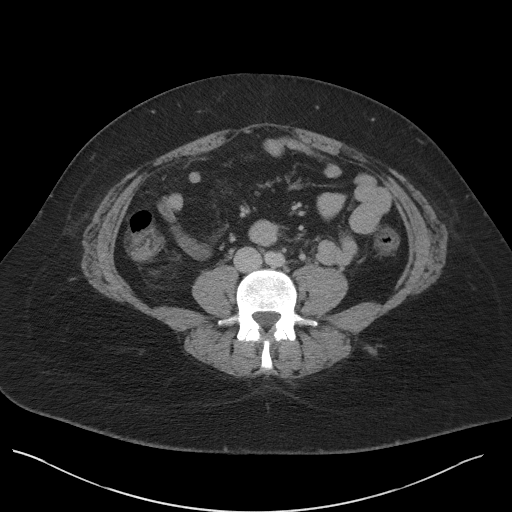
[im 53/96  soft-tissue]
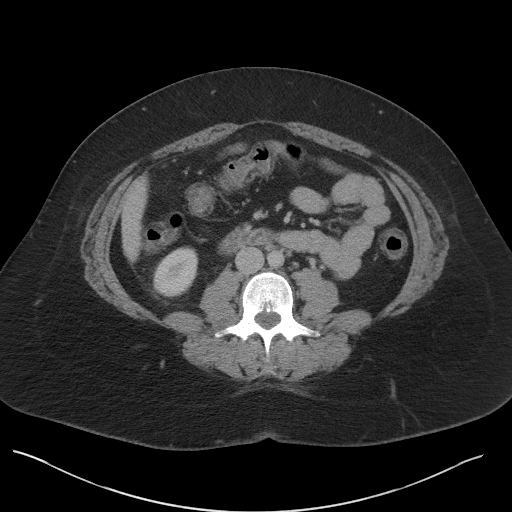
[im 64/96  soft-tissue]
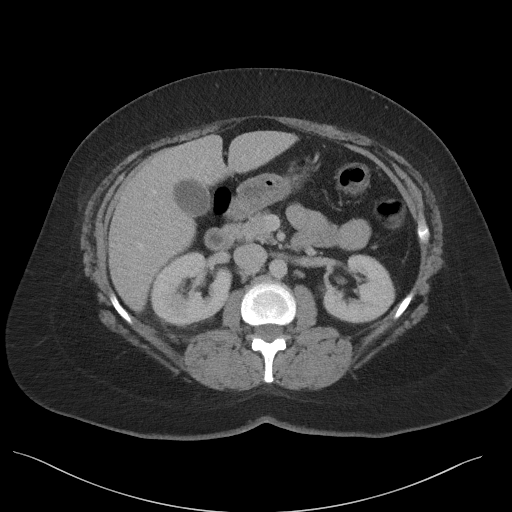
[im 64/96  bone]
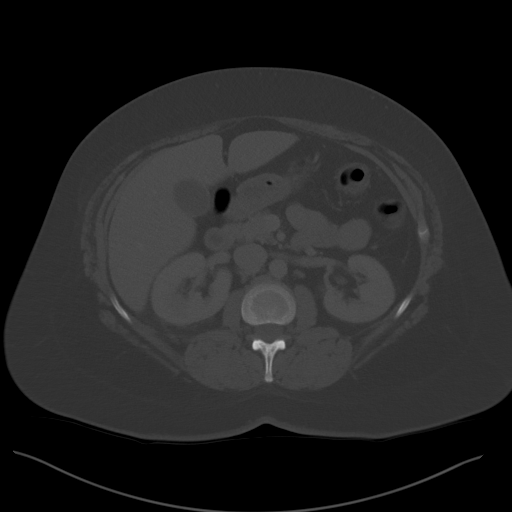
[im 69/96  soft-tissue]
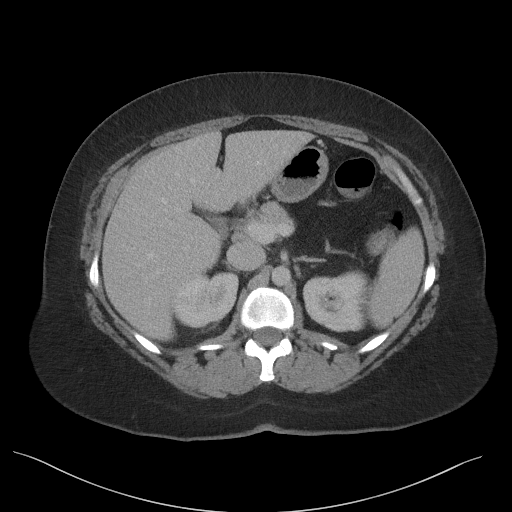
[im 74/96  soft-tissue]
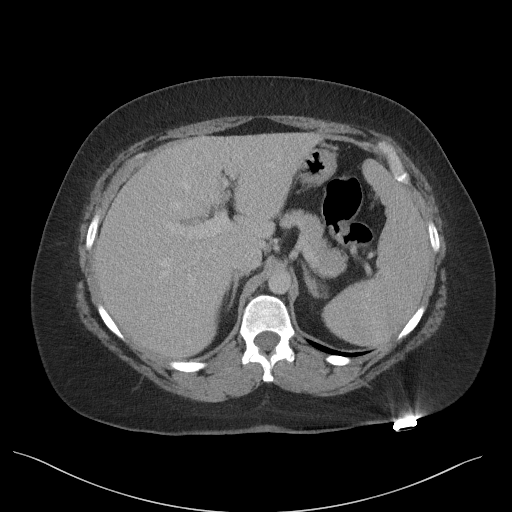
[im 85/96  soft-tissue]
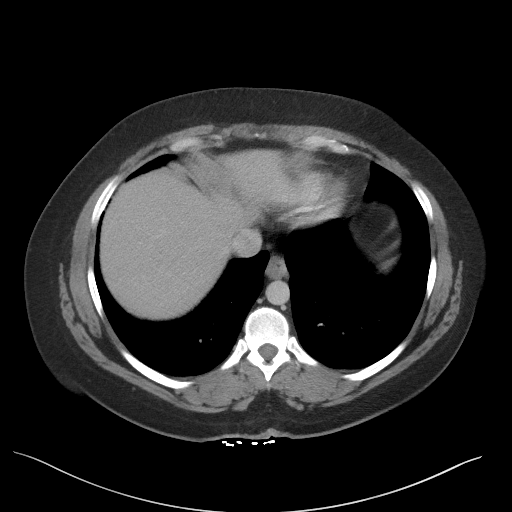
[im 90/96  soft-tissue]
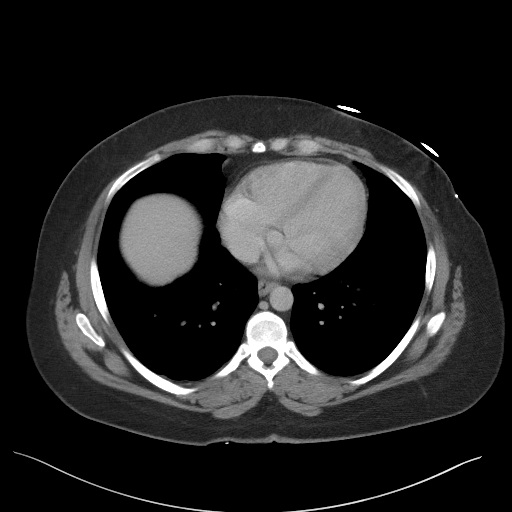

[Series 5: coronal st · coronal · 0.83mm/px · 3 of 117 slices shown]
[im 39/117  soft-tissue]
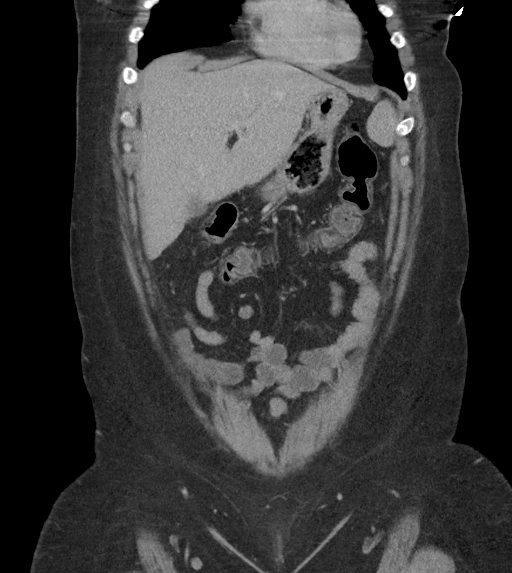
[im 52/117  soft-tissue]
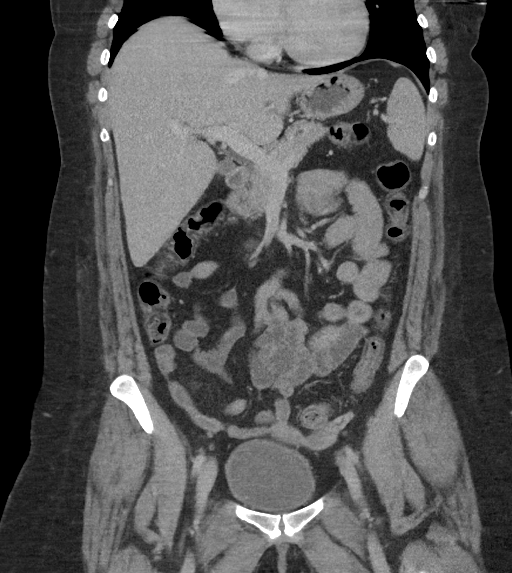
[im 65/117  soft-tissue]
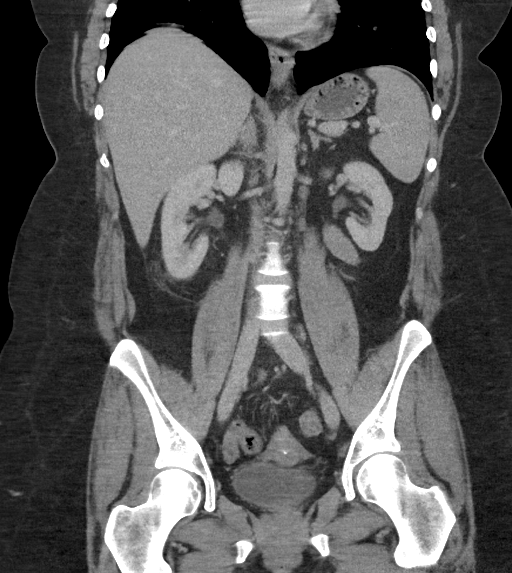

[16 of 46 positions shown; findings below may reference images not displayed]

FINDINGS: Lower chest: Mild linear atelectasis in the right greater than left
lower lobe.

Hepatobiliary: No focal liver abnormality is seen. No gallstones,
gallbladder wall thickening, or biliary dilatation.

Pancreas: No ductal dilatation or inflammation.

Spleen: Normal in size with calcified granuloma.

Adrenals/Urinary Tract: Normal adrenal glands. Enlarged right kidney
with heterogeneous enhancement and mild perinephric edema. No
evidence of renal or ureteral stone. Left kidney is normal. Urinary
bladder is physiologically distended. Mild wall thickening about the
dome.

Stomach/Bowel: Tiny hiatal hernia. Stomach is physiologically
distended. No evidence of small bowel wall thickening, inflammatory
change, or obstruction. Normal appendix. Scattered submucosal fatty
infiltration throughout the colon consistent with chronic/prior
inflammation. No wall thickening or acute inflammatory change.

Vascular/Lymphatic: No significant vascular findings are present. No
enlarged abdominal or pelvic lymph nodes.

Reproductive: Intrauterine device appropriately positioned in the
uterus. No adnexal mass.

Other: Small fat containing umbilical hernia. No free air, free
fluid, or intra-abdominal fluid collection.

Musculoskeletal: There are no acute or suspicious osseous
abnormalities.
IMPRESSION: 1. Heterogeneous enhancement of the right kidney with mild
perinephric edema consistent with pyelonephritis.
2. Submucosal fatty infiltration throughout the colon suggesting
chronic/prior inflammation. No acute inflammatory change.

## 2021-01-13 ENCOUNTER — Emergency Department (HOSPITAL_COMMUNITY): Payer: Medicaid Other

## 2021-01-13 ENCOUNTER — Other Ambulatory Visit: Payer: Self-pay

## 2021-01-13 ENCOUNTER — Encounter (HOSPITAL_COMMUNITY): Payer: Self-pay | Admitting: Emergency Medicine

## 2021-01-13 ENCOUNTER — Inpatient Hospital Stay (HOSPITAL_COMMUNITY)
Admission: EM | Admit: 2021-01-13 | Discharge: 2021-01-17 | DRG: 392 | Disposition: A | Discharge status: Home / Self Care | Payer: Medicaid Other | Attending: Internal Medicine | Admitting: Internal Medicine

## 2021-01-13 DIAGNOSIS — Z79899 Other long term (current) drug therapy: Secondary | ICD-10-CM

## 2021-01-13 DIAGNOSIS — K219 Gastro-esophageal reflux disease without esophagitis: Secondary | ICD-10-CM | POA: Diagnosis present

## 2021-01-13 DIAGNOSIS — Z6841 Body Mass Index (BMI) 40.0 and over, adult: Secondary | ICD-10-CM

## 2021-01-13 DIAGNOSIS — R112 Nausea with vomiting, unspecified: Secondary | ICD-10-CM

## 2021-01-13 DIAGNOSIS — Z8249 Family history of ischemic heart disease and other diseases of the circulatory system: Secondary | ICD-10-CM

## 2021-01-13 DIAGNOSIS — Z975 Presence of (intrauterine) contraceptive device: Secondary | ICD-10-CM

## 2021-01-13 DIAGNOSIS — K648 Other hemorrhoids: Secondary | ICD-10-CM | POA: Diagnosis present

## 2021-01-13 DIAGNOSIS — R1310 Dysphagia, unspecified: Secondary | ICD-10-CM | POA: Diagnosis present

## 2021-01-13 DIAGNOSIS — R824 Acetonuria: Secondary | ICD-10-CM | POA: Diagnosis present

## 2021-01-13 DIAGNOSIS — F17210 Nicotine dependence, cigarettes, uncomplicated: Secondary | ICD-10-CM | POA: Diagnosis present

## 2021-01-13 DIAGNOSIS — Z7151 Drug abuse counseling and surveillance of drug abuser: Secondary | ICD-10-CM

## 2021-01-13 DIAGNOSIS — E876 Hypokalemia: Secondary | ICD-10-CM | POA: Diagnosis present

## 2021-01-13 DIAGNOSIS — Z20822 Contact with and (suspected) exposure to covid-19: Secondary | ICD-10-CM | POA: Diagnosis present

## 2021-01-13 DIAGNOSIS — R197 Diarrhea, unspecified: Secondary | ICD-10-CM

## 2021-01-13 DIAGNOSIS — Z28310 Unvaccinated for covid-19: Secondary | ICD-10-CM

## 2021-01-13 DIAGNOSIS — Z716 Tobacco abuse counseling: Secondary | ICD-10-CM

## 2021-01-13 DIAGNOSIS — K529 Noninfective gastroenteritis and colitis, unspecified: Principal | ICD-10-CM | POA: Diagnosis present

## 2021-01-13 HISTORY — DX: Other specified health status: Z78.9

## 2021-01-13 LAB — URINALYSIS, ROUTINE W REFLEX MICROSCOPIC
Bilirubin Urine: NEGATIVE
Glucose, UA: NEGATIVE mg/dL
Ketones, ur: >80 mg/dL — AB
Leukocytes,Ua: NEGATIVE
Nitrite: NEGATIVE
Protein, ur: NEGATIVE mg/dL
Specific Gravity, Urine: 1.01 (ref 1.005–1.030)
pH: 5.5 (ref 5.0–8.0)

## 2021-01-13 LAB — COMPREHENSIVE METABOLIC PANEL
ALT: 15 U/L (ref 0–44)
AST: 15 U/L (ref 15–41)
Albumin: 4.2 g/dL (ref 3.5–5.0)
Alkaline Phosphatase: 56 U/L (ref 38–126)
Anion gap: 8 (ref 5–15)
BUN: 9 mg/dL (ref 6–20)
CO2: 24 mmol/L (ref 22–32)
Calcium: 8.8 mg/dL — ABNORMAL LOW (ref 8.9–10.3)
Chloride: 105 mmol/L (ref 98–111)
Creatinine, Ser: 0.7 mg/dL (ref 0.44–1.00)
GFR, Estimated: >60 mL/min (ref >60)
Glucose, Bld: 121 mg/dL — ABNORMAL HIGH (ref 70–99)
Potassium: 4 mmol/L (ref 3.5–5.1)
Sodium: 137 mmol/L (ref 135–145)
Total Bilirubin: 1.5 mg/dL — ABNORMAL HIGH (ref 0.3–1.2)
Total Protein: 6.9 g/dL (ref 6.5–8.1)

## 2021-01-13 LAB — CBC
HCT: 50.2 % — ABNORMAL HIGH (ref 36.0–46.0)
Hemoglobin: 16.3 g/dL — ABNORMAL HIGH (ref 12.0–15.0)
MCH: 32 pg (ref 26.0–34.0)
MCHC: 32.5 g/dL (ref 30.0–36.0)
MCV: 98.4 fL (ref 80.0–100.0)
Platelets: 279 10*3/uL (ref 150–400)
RBC: 5.1 MIL/uL (ref 3.87–5.11)
RDW: 12.5 % (ref 11.5–15.5)
WBC: 12.3 10*3/uL — ABNORMAL HIGH (ref 4.0–10.5)
nRBC: 0 % (ref 0.0–0.2)

## 2021-01-13 LAB — RESP PANEL BY RT-PCR (FLU A&B, COVID) ARPGX2
Influenza A by PCR: NEGATIVE
Influenza B by PCR: NEGATIVE
SARS Coronavirus 2 by RT PCR: NEGATIVE

## 2021-01-13 LAB — URINALYSIS, MICROSCOPIC (REFLEX): Bacteria, UA: NONE SEEN

## 2021-01-13 LAB — HCG, SERUM, QUALITATIVE: Preg, Serum: NEGATIVE

## 2021-01-13 LAB — LIPASE, BLOOD: Lipase: 39 U/L (ref 11–51)

## 2021-01-13 MED ORDER — CIPROFLOXACIN IN D5W 400 MG/200ML IV SOLN
400.0000 mg | Freq: Two times a day (BID) | INTRAVENOUS | Status: DC
  Administered 2021-01-14 – 2021-01-16 (×7): 400 mg via INTRAVENOUS
  Filled 2021-01-13 (×7): qty 200

## 2021-01-13 MED ORDER — ONDANSETRON HCL 4 MG/2ML IJ SOLN
4.0000 mg | Freq: Once | INTRAMUSCULAR | Status: AC
  Administered 2021-01-13: 4 mg via INTRAVENOUS
  Filled 2021-01-13: qty 2

## 2021-01-13 MED ORDER — SODIUM CHLORIDE 0.9 % IV BOLUS
1000.0000 mL | Freq: Once | INTRAVENOUS | Status: AC
  Administered 2021-01-13: 1000 mL via INTRAVENOUS

## 2021-01-13 MED ORDER — SODIUM CHLORIDE 0.9 % IV SOLN
25.0000 mg | Freq: Once | INTRAVENOUS | Status: AC
  Administered 2021-01-13: 25 mg via INTRAVENOUS
  Filled 2021-01-13: qty 1

## 2021-01-13 MED ORDER — DROPERIDOL 2.5 MG/ML IJ SOLN: 1.2500 mg | Freq: Once | INTRAMUSCULAR | Status: DC

## 2021-01-13 MED ORDER — METRONIDAZOLE 500 MG/100ML IV SOLN
500.0000 mg | Freq: Two times a day (BID) | INTRAVENOUS | Status: DC
  Administered 2021-01-14 – 2021-01-16 (×7): 500 mg via INTRAVENOUS
  Filled 2021-01-13 (×7): qty 100

## 2021-01-13 MED ORDER — DIPHENHYDRAMINE HCL 50 MG/ML IJ SOLN
12.5000 mg | Freq: Once | INTRAMUSCULAR | Status: AC
  Administered 2021-01-13: 12.5 mg via INTRAVENOUS
  Filled 2021-01-13: qty 1

## 2021-01-13 MED ORDER — FAMOTIDINE IN NACL 20-0.9 MG/50ML-% IV SOLN
20.0000 mg | Freq: Once | INTRAVENOUS | Status: AC
  Administered 2021-01-13: 20 mg via INTRAVENOUS
  Filled 2021-01-13: qty 50

## 2021-01-13 MED ORDER — PROMETHAZINE HCL 25 MG/ML IJ SOLN
INTRAMUSCULAR | Status: AC
  Filled 2021-01-13: qty 1

## 2021-01-13 MED ORDER — IOHEXOL 300 MG/ML  SOLN
100.0000 mL | Freq: Once | INTRAMUSCULAR | Status: AC | PRN
  Administered 2021-01-13: 100 mL via INTRAVENOUS

## 2021-01-13 MED ORDER — PROCHLORPERAZINE EDISYLATE 10 MG/2ML IJ SOLN
10.0000 mg | Freq: Once | INTRAMUSCULAR | Status: AC
  Administered 2021-01-13: 10 mg via INTRAVENOUS
  Filled 2021-01-13: qty 2

## 2021-01-13 MED ORDER — MORPHINE SULFATE (PF) 4 MG/ML IV SOLN
4.0000 mg | Freq: Once | INTRAVENOUS | Status: DC
  Filled 2021-01-13: qty 1

## 2021-01-13 NOTE — ED Provider Notes (Signed)
[facility] EMERGENCY DEPARTMENT Provider Note   CSN: [id number] Arrival date & time: 01/13/21  1825     History Chief Complaint  Patient presents with   Abdominal Pain   Emesis    Vomiting and diarrhea    Kristen Bates is a 35 y.o. female.  She has no significant past medical history.  Complaining of acute onset of nausea vomiting diarrhea that started at 11 AM today.  Possibly sees a little bit of blood in the vomit but she thinks it is due to excessive straining.  No fevers or chills.  Has crampy abdominal pain.  No recent travel or sick contacts.  No recent antibiotics.  Zofran without any improvement.  Had a similar episode about 5 years ago with no clear etiology identified.  The history is provided by the patient.  Abdominal Pain Pain location: Lower abdomen. Pain quality: cramping   Pain severity:  Severe Onset quality:  Gradual Duration:  8 hours Timing:  Intermittent Progression:  Unchanged Chronicity:  Recurrent Context: not recent travel, not sick contacts, not suspicious food intake and not trauma   Relieved by:  Nothing Worsened by:  Nothing Ineffective treatments:  None tried Associated symptoms: diarrhea, nausea and vomiting   Associated symptoms: no chest pain, no constipation, no cough, no dysuria, no fever, no hematochezia, no melena, no shortness of breath and no sore throat   Emesis Associated symptoms: abdominal pain and diarrhea   Associated symptoms: no cough, no fever, no headaches and no sore throat       History reviewed. No pertinent past medical history.  There are no problems to display for this patient.   Past Surgical History:  Procedure Laterality Date   CESAREAN SECTION     DENTAL SURGERY       OB History   No obstetric history on file.     Family History  Problem Relation Age of Onset   Heart failure Mother    Diabetes Father     Social History   Tobacco Use   Smoking status: Every Day    Packs/day: 1.00    Types:  Cigarettes   Smokeless tobacco: Never  Substance Use Topics   Alcohol use: Yes    Comment: occ   Drug use: No    Home Medications Prior to Admission medications   Medication Sig Start Date End Date Taking? Authorizing Provider  azithromycin (ZITHROMAX) 250 MG tablet Take 1 tablet (250 mg total) by mouth daily. Take one tablet daily starting on 09/15/13. 09/14/13   Burgess Amor, PA-C  benzonatate (TESSALON) 100 MG capsule Take 2 capsules (200 mg total) by mouth 3 (three) times daily as needed for cough. 09/14/13   Burgess Amor, PA-C  cephALEXin (KEFLEX) 500 MG capsule Take 1 capsule (500 mg total) by mouth 4 (four) times daily. 09/25/18   Vanetta Mulders, MD  dextromethorphan-guaiFENesin [facility] DM) 30-600 MG per 12 hr tablet Take 1 tablet by mouth 2 (two) times daily as needed for cough.    [provider]  levonorgestrel (MIRENA) 20 MCG/24HR IUD 1 each by Intrauterine route once.    [provider]  omeprazole (PRILOSEC) 20 MG capsule Take 1 capsule (20 mg total) by mouth daily. 09/25/18   Vanetta Mulders, MD  ondansetron (ZOFRAN ODT) 4 MG disintegrating tablet Take 1 tablet (4 mg total) by mouth every 8 (eight) hours as needed. 09/25/18   Vanetta Mulders, MD  predniSONE (DELTASONE) 10 MG tablet 6, 5, 4, 3, 2 then 1  tablet by mouth daily for 6 days total. 09/15/13   Evalee Jefferson, PA-C    Allergies    Patient has no known allergies.  Review of Systems   Review of Systems  Constitutional:  Negative for fever.  HENT:  Negative for sore throat.   Eyes:  Negative for visual disturbance.  Respiratory:  Negative for cough and shortness of breath.   Cardiovascular:  Negative for chest pain.  Gastrointestinal:  Positive for abdominal pain, diarrhea, nausea and vomiting. Negative for constipation, hematochezia and melena.  Genitourinary:  Negative for dysuria.  Musculoskeletal:  Negative for neck pain.  Skin:  Negative for rash.  Neurological:  Negative for headaches.    Physical Exam Updated Vital Signs BP (!) 173/87 (BP Location: Right Arm)   Pulse (!) 56   Temp 98 F (36.7 C) (Oral)   Resp (!) 22   Ht 5\' 4"  (1.626 m)   Wt 120.2 kg   SpO2 98%   BMI 45.49 kg/m   Physical Exam Vitals and nursing note reviewed.  Constitutional:      General: She is not in acute distress.    Appearance: Normal appearance. She is well-developed.  HENT:     Head: Normocephalic and atraumatic.  Eyes:     Conjunctiva/sclera: Conjunctivae normal.  Cardiovascular:     Rate and Rhythm: Normal rate and regular rhythm.     Heart sounds: No murmur heard. Pulmonary:     Effort: Pulmonary effort is normal. No respiratory distress.     Breath sounds: Normal breath sounds.  Abdominal:     Palpations: Abdomen is soft.     Tenderness: There is no abdominal tenderness. There is no guarding or rebound.  Musculoskeletal:        General: No swelling.     Cervical back: Neck supple.  Skin:    General: Skin is warm and dry.     Capillary Refill: Capillary refill takes less than 2 seconds.  Neurological:     General: No focal deficit present.     Mental Status: She is alert.  Psychiatric:        Mood and Affect: Mood normal.    ED Results / Procedures / Treatments   Labs (all labs ordered are listed, but only abnormal results are displayed) Labs Reviewed  COMPREHENSIVE METABOLIC PANEL - Abnormal; Notable for the following components:      Result Value   Glucose, Bld 121 (*)    Calcium 8.8 (*)    Total Bilirubin 1.5 (*)    All other components within normal limits  CBC - Abnormal; Notable for the following components:   WBC 12.3 (*)    Hemoglobin 16.3 (*)    HCT 50.2 (*)    All other components within normal limits  URINALYSIS, ROUTINE W REFLEX MICROSCOPIC - Abnormal; Notable for the following components:   Hgb urine dipstick SMALL (*)    Ketones, ur >80 (*)    All other components within normal limits  COMPREHENSIVE METABOLIC PANEL - Abnormal; Notable for  the following components:   Potassium 3.3 (*)    Calcium 7.9 (*)    Total Protein 5.8 (*)    Albumin 3.4 (*)    AST 11 (*)    Total Bilirubin 1.5 (*)    All other components within normal limits  MAGNESIUM - Abnormal; Notable for the following components:   Magnesium 1.5 (*)    All other components within normal limits  CBC WITH DIFFERENTIAL/PLATELET - Abnormal; Notable  for the following components:   WBC 12.8 (*)    Neutro Abs 9.4 (*)    Abs Immature Granulocytes 0.09 (*)    All other components within normal limits  RESP PANEL BY RT-PCR (FLU A&B, COVID) [id number]  GASTROINTESTINAL PANEL BY PCR, STOOL (REPLACES STOOL CULTURE)  LIPASE, BLOOD  HCG, SERUM, QUALITATIVE  URINALYSIS, MICROSCOPIC (REFLEX)  SEDIMENTATION RATE  HIV ANTIBODY (ROUTINE TESTING W REFLEX)  C-REACTIVE PROTEIN    EKG None  Radiology CT Abdomen Pelvis W Contrast  Result Date: 01/13/2021 CLINICAL DATA:  Abdominal pain, nausea/vomiting/diarrhea, bradycardia EXAM: CT ABDOMEN AND PELVIS WITH CONTRAST TECHNIQUE: Multidetector CT imaging of the abdomen and pelvis was performed using the standard protocol following bolus administration of intravenous contrast. CONTRAST:  168mL OMNIPAQUE IOHEXOL 300 MG/ML  SOLN COMPARISON:  09/26/2018 FINDINGS: Lower chest: Lung bases are clear. Hepatobiliary: Liver is within normal limits. Gallbladder is unremarkable. No intrahepatic or extrahepatic ductal dilatation. Pancreas: Within normal limits. Spleen: Within normal limits. Adrenals/Urinary Tract: Adrenal glands are within normal limits. Kidneys are within normal limits.  No hydronephrosis. Bladder is within normal limits. Stomach/Bowel: Stomach is notable for a tiny hiatal hernia. No evidence of bowel obstruction. Normal appendix (series 2/image 66). Wall thickening involving the ascending and transverse colon (series 2/image 42), suggesting infectious/inflammatory colitis. Cecum and rectosigmoid colon are preserved. Vascular/Lymphatic:  No evidence of abdominal aortic aneurysm. No suspicious abdominopelvic lymphadenopathy. Reproductive: Uterus is notable for an IUD in satisfactory position. Bilateral ovaries are within normal limits. Other: No abdominopelvic ascites. Musculoskeletal: Visualized osseous structures are within normal limits. IMPRESSION: Wall thickening involving the ascending and transverse colon, suggesting infectious/inflammatory colitis. Electronically Signed   By: Julian Hy M.D.   On: 01/13/2021 22:30    Procedures Procedures   Medications Ordered in ED Medications  morphine 4 MG/ML injection 4 mg (4 mg Intravenous Not Given 01/13/21 1929)  ciprofloxacin (CIPRO) IVPB 400 mg (400 mg Intravenous New Bag/Given 01/14/21 0012)  metroNIDAZOLE (FLAGYL) IVPB 500 mg (500 mg Intravenous New Bag/Given 01/14/21 0135)  ondansetron (ZOFRAN) injection 4 mg (has no administration in time range)  pantoprazole (PROTONIX) injection 40 mg (40 mg Intravenous Given 01/14/21 0839)  heparin injection 5,000 Units (5,000 Units Subcutaneous Given 01/14/21 0553)  0.9 %  sodium chloride infusion ( Intravenous New Bag/Given 01/14/21 0550)  acetaminophen (TYLENOL) tablet 650 mg (has no administration in time range)    Or  acetaminophen (TYLENOL) suppository 650 mg (has no administration in time range)  oxyCODONE (Oxy IR/ROXICODONE) immediate release tablet 5 mg (has no administration in time range)  morphine 2 MG/ML injection 2 mg (has no administration in time range)  loperamide (IMODIUM) capsule 2 mg (has no administration in time range)  promethazine (PHENERGAN) 12.5 mg in sodium chloride 0.9 % 50 mL IVPB (has no administration in time range)  sodium chloride 0.9 % bolus 1,000 mL (0 mLs Intravenous Stopped 01/13/21 2233)  promethazine (PHENERGAN) 25 mg in sodium chloride 0.9 % 50 mL IVPB (0 mg Intravenous Stopped 01/13/21 2258)  sodium chloride 0.9 % bolus 1,000 mL (0 mLs Intravenous Stopped 01/13/21 2258)  famotidine (PEPCID)  IVPB 20 mg premix (0 mg Intravenous Stopped 01/13/21 2233)  prochlorperazine (COMPAZINE) injection 10 mg (10 mg Intravenous Given 01/13/21 2105)  diphenhydrAMINE (BENADRYL) injection 12.5 mg (12.5 mg Intravenous Given 01/13/21 2106)  iohexol (OMNIPAQUE) 300 MG/ML solution 100 mL (100 mLs Intravenous Contrast Given 01/13/21 2159)  ondansetron (ZOFRAN) injection 4 mg (4 mg Intravenous Given 01/13/21 2321)  droperidol (INAPSINE) 2.5 MG/ML injection 5  mg (5 mg Intravenous Given 01/14/21 0249)    ED Course  I have reviewed the triage vital signs and the nursing notes.  Pertinent labs & imaging results that were available during my care of the patient were reviewed by me and considered in my medical decision making (see chart for details).  Clinical Course as of 01/14/21 [id number]  Kristen Bates Jan 13, 2021  [id number] Patient looks more comfortable and is resting.  Labs coming back without significant abnormalities other than mildly elevated white count. [MB]  2058 Patient woke up and said she was still so nauseous.  Have ordered another liter of fluid.  Ordered droperidol although it sounds like were out of stock.  Ordered Compazine and some Pepcid. [MB]  2237 Patient asleep after CT.  Mom states she threw up once after the CT. [MB]  2306 Patient still very nauseous.  With her CT findings recommended to her that we admit her for stabilization of her symptoms.  She is agreeable to plan. [MB]    Clinical Course User Index [MB] Hayden Rasmussen, MD   MDM Rules/Calculators/A&P                          This patient complains of nausea vomiting diarrhea abdominal pain; this involves an extensive number of treatment Options and is a complaint that carries with it a high risk of complications and Morbidity. The differential includes colitis, diverticulitis, obstruction, gastroenteritis, peptic ulcer disease  I ordered, reviewed and interpreted labs, which included CBC with mildly elevated white count elevated  hemoglobin likely reflecting some dehydration, chemistries normal, Elave T's fairly normal pregnancy and COVID and flu negative, urinalysis without signs of infection although does have ketones I ordered medication IV fluids IV Phenergan and Compazine I ordered imaging studies which included CT abdomen and pelvis and I independently    visualized and interpreted imaging which showed a sending transverse colonic wall thickening consistent with colitis Additional history obtained from patient's mother Previous records obtained and reviewed in epic no recent visits I consulted [facility] hospitalist Dr.Zierle-Ghosh And discussed lab and imaging findings  Critical Interventions: None  After the interventions stated above, I reevaluated the patient and found patient's pain to be improved although symptomatic still nauseous and cannot tolerate p.o.  Will discuss with hospitalist regarding admission.  Kristen Bates was evaluated in Emergency Department on 01/13/2021 for the symptoms described in the history of present illness. She was evaluated in the context of the global COVID-19 pandemic, which necessitated consideration that the patient might be at risk for infection with the SARS-CoV-2 virus that causes COVID-19. Institutional protocols and algorithms that pertain to the evaluation of patients at risk for COVID-19 are in a state of rapid change based on information released by regulatory bodies including the CDC and federal and state organizations. These policies and algorithms were followed during the patient's care in the ED.   Final Clinical Impression(s) / ED Diagnoses Final diagnoses:  Acute colitis  Nausea vomiting and diarrhea    Rx / DC Orders ED Discharge Orders     None        Hayden Rasmussen, MD 01/14/21 1005

## 2021-01-13 NOTE — ED Triage Notes (Signed)
Woke up at 11am with vomiting and diarrhea. Unable to keep anything down today. Chills

## 2021-01-13 NOTE — ED Notes (Signed)
Pt vomiting. Gave cloth and emesis bags. Let them know that [facility] was called for nausea med

## 2021-01-13 NOTE — ED Notes (Signed)
Upon entering the room. Patient is resting, had to arouse patient of RN arrival. Morphine not given. Pt HR is in the 40s. Patients main complaint is nausea at this time. Will let EDP know.

## 2021-01-14 ENCOUNTER — Encounter (HOSPITAL_COMMUNITY): Payer: Self-pay | Admitting: Family Medicine

## 2021-01-14 DIAGNOSIS — K529 Noninfective gastroenteritis and colitis, unspecified: Secondary | ICD-10-CM | POA: Diagnosis present

## 2021-01-14 DIAGNOSIS — Z20822 Contact with and (suspected) exposure to covid-19: Secondary | ICD-10-CM | POA: Diagnosis present

## 2021-01-14 DIAGNOSIS — Z79899 Other long term (current) drug therapy: Secondary | ICD-10-CM | POA: Diagnosis not present

## 2021-01-14 DIAGNOSIS — R197 Diarrhea, unspecified: Secondary | ICD-10-CM

## 2021-01-14 DIAGNOSIS — Z975 Presence of (intrauterine) contraceptive device: Secondary | ICD-10-CM | POA: Diagnosis not present

## 2021-01-14 DIAGNOSIS — R112 Nausea with vomiting, unspecified: Secondary | ICD-10-CM

## 2021-01-14 DIAGNOSIS — K648 Other hemorrhoids: Secondary | ICD-10-CM | POA: Diagnosis present

## 2021-01-14 DIAGNOSIS — F17210 Nicotine dependence, cigarettes, uncomplicated: Secondary | ICD-10-CM | POA: Diagnosis present

## 2021-01-14 DIAGNOSIS — K219 Gastro-esophageal reflux disease without esophagitis: Secondary | ICD-10-CM | POA: Diagnosis present

## 2021-01-14 DIAGNOSIS — R824 Acetonuria: Secondary | ICD-10-CM | POA: Diagnosis present

## 2021-01-14 DIAGNOSIS — Z28310 Unvaccinated for covid-19: Secondary | ICD-10-CM | POA: Diagnosis not present

## 2021-01-14 DIAGNOSIS — Z8249 Family history of ischemic heart disease and other diseases of the circulatory system: Secondary | ICD-10-CM | POA: Diagnosis not present

## 2021-01-14 DIAGNOSIS — R1310 Dysphagia, unspecified: Secondary | ICD-10-CM | POA: Diagnosis present

## 2021-01-14 DIAGNOSIS — E876 Hypokalemia: Secondary | ICD-10-CM | POA: Diagnosis present

## 2021-01-14 DIAGNOSIS — Z7151 Drug abuse counseling and surveillance of drug abuser: Secondary | ICD-10-CM | POA: Diagnosis not present

## 2021-01-14 DIAGNOSIS — Z716 Tobacco abuse counseling: Secondary | ICD-10-CM | POA: Diagnosis not present

## 2021-01-14 DIAGNOSIS — Z6841 Body Mass Index (BMI) 40.0 and over, adult: Secondary | ICD-10-CM | POA: Diagnosis not present

## 2021-01-14 LAB — CBC WITH DIFFERENTIAL/PLATELET
Abs Immature Granulocytes: 0.09 K/uL — ABNORMAL HIGH (ref 0.00–0.07)
Basophils Absolute: 0 K/uL (ref 0.0–0.1)
Basophils Relative: 0 %
Eosinophils Absolute: 0.1 K/uL (ref 0.0–0.5)
Eosinophils Relative: 1 %
HCT: 44.9 % (ref 36.0–46.0)
Hemoglobin: 14.6 g/dL (ref 12.0–15.0)
Immature Granulocytes: 1 %
Lymphocytes Relative: 17 %
Lymphs Abs: 2.2 K/uL (ref 0.7–4.0)
MCH: 32.1 pg (ref 26.0–34.0)
MCHC: 32.5 g/dL (ref 30.0–36.0)
MCV: 98.7 fL (ref 80.0–100.0)
Monocytes Absolute: 1 K/uL (ref 0.1–1.0)
Monocytes Relative: 8 %
Neutro Abs: 9.4 K/uL — ABNORMAL HIGH (ref 1.7–7.7)
Neutrophils Relative %: 73 %
Platelets: 229 K/uL (ref 150–400)
RBC: 4.55 MIL/uL (ref 3.87–5.11)
RDW: 12.6 % (ref 11.5–15.5)
WBC: 12.8 K/uL — ABNORMAL HIGH (ref 4.0–10.5)
nRBC: 0 % (ref 0.0–0.2)

## 2021-01-14 LAB — COMPREHENSIVE METABOLIC PANEL
ALT: 12 U/L (ref 0–44)
AST: 11 U/L — ABNORMAL LOW (ref 15–41)
Albumin: 3.4 g/dL — ABNORMAL LOW (ref 3.5–5.0)
Alkaline Phosphatase: 44 U/L (ref 38–126)
Anion gap: 5 (ref 5–15)
BUN: 6 mg/dL (ref 6–20)
CO2: 24 mmol/L (ref 22–32)
Calcium: 7.9 mg/dL — ABNORMAL LOW (ref 8.9–10.3)
Chloride: 108 mmol/L (ref 98–111)
Creatinine, Ser: 0.62 mg/dL (ref 0.44–1.00)
GFR, Estimated: >60 mL/min (ref >60)
Glucose, Bld: 99 mg/dL (ref 70–99)
Potassium: 3.3 mmol/L — ABNORMAL LOW (ref 3.5–5.1)
Sodium: 137 mmol/L (ref 135–145)
Total Bilirubin: 1.5 mg/dL — ABNORMAL HIGH (ref 0.3–1.2)
Total Protein: 5.8 g/dL — ABNORMAL LOW (ref 6.5–8.1)

## 2021-01-14 LAB — C DIFFICILE QUICK SCREEN W PCR REFLEX
C Diff antigen: NEGATIVE
C Diff interpretation: NOT DETECTED
C Diff toxin: NEGATIVE

## 2021-01-14 LAB — MAGNESIUM: Magnesium: 1.5 mg/dL — ABNORMAL LOW (ref 1.7–2.4)

## 2021-01-14 LAB — SEDIMENTATION RATE: Sed Rate: 4 mm/h (ref 0–22)

## 2021-01-14 LAB — HIV ANTIBODY (ROUTINE TESTING W REFLEX): HIV Screen 4th Generation wRfx: NONREACTIVE

## 2021-01-14 LAB — C-REACTIVE PROTEIN: CRP: 0.5 mg/dL (ref <1.0)

## 2021-01-14 MED ORDER — ACETAMINOPHEN 650 MG RE SUPP: 650.0000 mg | Freq: Four times a day (QID) | RECTAL | Status: DC | PRN

## 2021-01-14 MED ORDER — HEPARIN SODIUM (PORCINE) 5000 UNIT/ML IJ SOLN
5000.0000 [IU] | Freq: Three times a day (TID) | INTRAMUSCULAR | Status: DC
  Administered 2021-01-14 – 2021-01-16 (×8): 5000 [IU] via SUBCUTANEOUS
  Filled 2021-01-14 (×10): qty 1

## 2021-01-14 MED ORDER — ONDANSETRON HCL 4 MG/2ML IJ SOLN
4.0000 mg | Freq: Four times a day (QID) | INTRAMUSCULAR | Status: DC | PRN
  Administered 2021-01-16: 08:00:00 4 mg via INTRAVENOUS
  Filled 2021-01-14 (×2): qty 2

## 2021-01-14 MED ORDER — DROPERIDOL 2.5 MG/ML IJ SOLN
5.0000 mg | Freq: Once | INTRAMUSCULAR | Status: AC
  Administered 2021-01-14: 5 mg via INTRAVENOUS
  Filled 2021-01-14: qty 2

## 2021-01-14 MED ORDER — OXYCODONE HCL 5 MG PO TABS
5.0000 mg | ORAL_TABLET | ORAL | Status: DC | PRN
  Administered 2021-01-15: 5 mg via ORAL
  Filled 2021-01-14 (×2): qty 1

## 2021-01-14 MED ORDER — ACETAMINOPHEN 325 MG PO TABS
650.0000 mg | ORAL_TABLET | Freq: Four times a day (QID) | ORAL | Status: DC | PRN
  Administered 2021-01-14: 650 mg via ORAL
  Filled 2021-01-14: qty 2

## 2021-01-14 MED ORDER — MAGNESIUM SULFATE 2 GM/50ML IV SOLN
2.0000 g | Freq: Once | INTRAVENOUS | Status: AC
  Administered 2021-01-14: 2 g via INTRAVENOUS
  Filled 2021-01-14: qty 50

## 2021-01-14 MED ORDER — SODIUM CHLORIDE 0.9 % IV SOLN
12.5000 mg | Freq: Four times a day (QID) | INTRAVENOUS | Status: DC | PRN
  Filled 2021-01-14: qty 0.5

## 2021-01-14 MED ORDER — METOCLOPRAMIDE HCL 5 MG/ML IJ SOLN: 10.0000 mg | Freq: Three times a day (TID) | INTRAMUSCULAR | Status: DC | PRN

## 2021-01-14 MED ORDER — LOPERAMIDE HCL 2 MG PO CAPS: 2.0000 mg | ORAL_CAPSULE | ORAL | Status: DC | PRN

## 2021-01-14 MED ORDER — POTASSIUM CHLORIDE CRYS ER 20 MEQ PO TBCR
40.0000 meq | EXTENDED_RELEASE_TABLET | ORAL | Status: AC
  Administered 2021-01-14 (×2): 40 meq via ORAL
  Filled 2021-01-14 (×2): qty 2

## 2021-01-14 MED ORDER — MORPHINE SULFATE (PF) 2 MG/ML IV SOLN
2.0000 mg | INTRAVENOUS | Status: DC | PRN
  Administered 2021-01-15 – 2021-01-16 (×2): 2 mg via INTRAVENOUS
  Filled 2021-01-14 (×2): qty 1

## 2021-01-14 MED ORDER — SODIUM CHLORIDE 0.9 % IV SOLN: INTRAVENOUS | Status: DC

## 2021-01-14 MED ORDER — PANTOPRAZOLE SODIUM 40 MG IV SOLR
40.0000 mg | INTRAVENOUS | Status: DC
  Administered 2021-01-14 – 2021-01-16 (×3): 40 mg via INTRAVENOUS
  Filled 2021-01-14 (×3): qty 40

## 2021-01-14 NOTE — Progress Notes (Signed)
  Transition of Care [facility]) Screening Note   Patient Details  Name: OLIANA GOWENS Date of Birth: 11/08/1985   [facility] of [facility] CM/SW Contact:    Villa Herb, LCSWA Phone Number: 01/14/2021, 11:37 AM    Transition of Care Department [facility]) has reviewed patient and no TOC needs have been identified at this time. We will continue to monitor patient advancement through interdisciplinary progression rounds. If new patient transition needs arise, please place a TOC consult.

## 2021-01-14 NOTE — Consult Note (Signed)
Referring Provider: Dr. Cordelia Poche  Primary Care Physician:  Patient, No Pcp Per (Inactive) Primary Gastroenterologist:  previously unassigned, Dr. Abbey Chatters  Date of Admission: 01/13/21 Date of Consultation: 01/14/21  Reason for Consultation:  Colitis   HPI:  Kristen Bates is a 35 y.o. year old female presenting to the ED after acute onset of N/V/D yesterday morning. Pain worsened in severity, prompting admission. CT abd/pelvis with contrast noted wall thickening involving ascending and transverse colon. GI now consulted due to colitis.   Prior to admission: patient reports waking at 11 am yesterday with a cute N/V. Associated diarrhea. Had 8-10 loose stools. Last diarrhea prior to admission. N/V continued throughout the evening. Vomited after CT with contrast. No vomiting since that time. Abdominal pain located at umbilicus. Abdominal pain comes and goes. Crampy/sharp pain. No rectal bleeding.   Similar presentations have occurred on 2 other occasions in 2020 and then [facility] around 2018/2019. CT abd/pelvis with contrast in 2020 with submucosal fatty infiltration throughout colon suggesting chronic/prior inflammation.Marland Kitchen Has history of chronic stomach problems. Never has solid bowel movements most of the time. Usually 2-3 BMs per day. No rectal bleeding. Has started [employer] recently.  No known family history of IBD. Very distant relatives (maternal great aunts) with history of colon cancer. Mother has history of polyps requiring 5 year surveillance intervals.   Alcohol: 2-3 times per week. Drinks 2-3 wine coolers at a time. + Marijuana daily. No chronic N/V.   Past Medical History:  Diagnosis Date   Medical history non-contributory     Past Surgical History:  Procedure Laterality Date   CESAREAN SECTION     DENTAL SURGERY      Prior to Admission medications   Medication Sig Start Date End Date Taking? Authorizing Provider  ibuprofen (ADVIL) 400 MG tablet Take 400 mg by mouth  every 6 (six) hours as needed.   Yes [provider]  Multiple Vitamin (MULTIVITAMIN WITH MINERALS) TABS tablet Take 1 tablet by mouth daily.   Yes [provider]  naproxen sodium (ALEVE) 220 MG tablet Take 220 mg by mouth 2 (two) times daily as needed.   Yes [provider]  omeprazole (PRILOSEC) 20 MG capsule Take 1 capsule (20 mg total) by mouth daily. Patient taking differently: Take 20 mg by mouth daily as needed. 09/25/18  Yes Fredia Sorrow, MD  azithromycin (ZITHROMAX) 250 MG tablet Take 1 tablet (250 mg total) by mouth daily. Take one tablet daily starting on 09/15/13. Patient not taking: Reported on 01/13/2021 09/14/13   Evalee Jefferson, PA-C  benzonatate (TESSALON) 100 MG capsule Take 2 capsules (200 mg total) by mouth 3 (three) times daily as needed for cough. Patient not taking: Reported on 01/13/2021 09/14/13   Evalee Jefferson, PA-C  cephALEXin (KEFLEX) 500 MG capsule Take 1 capsule (500 mg total) by mouth 4 (four) times daily. Patient not taking: Reported on 01/13/2021 09/25/18   Fredia Sorrow, MD  dextromethorphan-guaiFENesin [facility] DM) 30-600 MG per 12 hr tablet Take 1 tablet by mouth 2 (two) times daily as needed for cough. Patient not taking: Reported on 01/13/2021    [provider]  levonorgestrel (MIRENA) 20 MCG/24HR IUD 1 each by Intrauterine route once.    [provider]  ondansetron (ZOFRAN ODT) 4 MG disintegrating tablet Take 1 tablet (4 mg total) by mouth every 8 (eight) hours as needed. Patient not taking: Reported on 01/13/2021 09/25/18   Fredia Sorrow, MD  predniSONE (DELTASONE) 10 MG tablet 6, 5, 4, 3,  2 then 1 tablet by mouth daily for 6 days total. Patient not taking: Reported on 01/13/2021 09/15/13   Burgess Amor, PA-C    Current Facility-Administered Medications  Medication Dose Route Frequency Provider Last Rate Last Admin   0.9 %  sodium chloride infusion   Intravenous Continuous Zierle-Ghosh, Asia B, DO 100 mL/hr at  01/14/21 0550 New Bag at 01/14/21 0550   acetaminophen (TYLENOL) tablet 650 mg  650 mg Oral Q6H PRN Zierle-Ghosh, Asia B, DO       Or   acetaminophen (TYLENOL) suppository 650 mg  650 mg Rectal Q6H PRN Zierle-Ghosh, Asia B, DO       ciprofloxacin (CIPRO) IVPB 400 mg  400 mg Intravenous Q12H Zierle-Ghosh, Asia B, DO 200 mL/hr at 01/14/21 0012 400 mg at 01/14/21 0012   heparin injection 5,000 Units  5,000 Units Subcutaneous Q8H Zierle-Ghosh, Asia B, DO   5,000 Units at 01/14/21 0553   loperamide (IMODIUM) capsule 2 mg  2 mg Oral PRN Zierle-Ghosh, Asia B, DO       metroNIDAZOLE (FLAGYL) IVPB 500 mg  500 mg Intravenous Q12H Zierle-Ghosh, Asia B, DO 100 mL/hr at 01/14/21 0135 500 mg at 01/14/21 0135   morphine 2 MG/ML injection 2 mg  2 mg Intravenous Q2H PRN Zierle-Ghosh, Asia B, DO       morphine 4 MG/ML injection 4 mg  4 mg Intravenous Once Zierle-Ghosh, Asia B, DO       ondansetron (ZOFRAN) injection 4 mg  4 mg Intravenous Q6H PRN Zierle-Ghosh, Asia B, DO       oxyCODONE (Oxy IR/ROXICODONE) immediate release tablet 5 mg  5 mg Oral Q4H PRN Zierle-Ghosh, Asia B, DO       pantoprazole (PROTONIX) injection 40 mg  40 mg Intravenous Q24H Zierle-Ghosh, Asia B, DO   40 mg at 01/14/21 0839   potassium chloride SA (KLOR-CON M) CR tablet 40 mEq  40 mEq Oral Q4H Narda Bonds, MD       promethazine (PHENERGAN) 12.5 mg in sodium chloride 0.9 % 50 mL IVPB  12.5 mg Intravenous Q6H PRN Zierle-Ghosh, Asia B, DO        Allergies as of 01/13/2021   (No Known Allergies)    Family History  Problem Relation Age of Onset   Heart failure Mother    Colon polyps Mother    Diabetes Father    Colon cancer Neg Hx        no first degree relatives. Notes maternal great aunts with colon cancer   Inflammatory bowel disease Neg Hx     Social History   Socioeconomic History   Marital status: Single    Spouse name: Not on file   Number of children: Not on file   Years of education: Not on file   Highest education  level: Not on file  Occupational History   Not on file  Tobacco Use   Smoking status: Every Day    Packs/day: 1.00    Types: Cigarettes   Smokeless tobacco: Never  Substance and Sexual Activity   Alcohol use: Yes    Comment: several times per week. Usually 2-3 wine coolers at a time.   Drug use: Yes    Types: Marijuana    Comment: daily   Sexual activity: Yes    Birth control/protection: I.U.D.  Other Topics Concern   Not on file  Social History Narrative   Not on file   Social Determinants of Health   Financial Resource Strain: Not  on file  Food Insecurity: Not on file  Transportation Needs: Not on file  Physical Activity: Not on file  Stress: Not on file  Social Connections: Not on file  Intimate Partner Violence: Not on file    Review of Systems: Gen: Denies fever, chills, loss of appetite, change in weight or weight loss CV: Denies chest pain, heart palpitations, syncope, edema  Resp: Denies shortness of breath with rest, cough, wheezing GI: see HPI GU : Denies urinary burning, urinary frequency, urinary incontinence.  MS: Denies joint pain,swelling, cramping Derm: Denies rash, itching, dry skin Psych: Denies depression, anxiety,confusion, or memory loss Heme: Denies bruising, bleeding, and enlarged lymph nodes.  Physical Exam: Vital signs in last 24 hours: Temp:  [97.7 F (36.5 C)-98.9 F (37.2 C)] 98.3 F (36.8 C) (12/12 0754) Pulse Rate:  [45-99] 84 (12/12 0754) Resp:  [15-22] 17 (12/12 0754) BP: (102-173)/(55-97) 102/59 (12/12 0754) SpO2:  [96 %-100 %] 99 % (12/12 0754) Weight:  [118.7 kg-120.2 kg] 118.7 kg (12/11 2320) Last BM Date: 01/13/21 General:   Alert,  Well-developed, well-nourished, pleasant and cooperative in NAD Head:  Normocephalic and atraumatic. Eyes:  Sclera clear, no icterus.   Conjunctiva pink. Ears:  Normal auditory acuity. Nose:  No deformity, discharge,  or lesions. Lungs:  Clear throughout to auscultation.    Heart:  S1 S2  present; no murmurs, clicks, rubs,  or gallops. Abdomen:  Soft, TTP at umbilicus, lower abdomen, and LLQ and nondistended. No masses, hepatosplenomegaly or hernias noted. Normal bowel sounds, without guarding, and without rebound.   Rectal:  Deferred until time of colonoscopy.   Msk:  Symmetrical without gross deformities. Normal posture. Extremities:  Without edema. Neurologic:  Alert and  oriented x4 Psych:  Alert and cooperative. Normal mood and affect.  Intake/Output from previous day: 12/11 0701 - 12/12 0700 In: 315.1 [I.V.:15.1; IV Piggyback:300] Out: -  Intake/Output this shift: No intake/output data recorded.  Lab Results: Recent Labs    01/13/21 1910 01/14/21 0445  WBC 12.3* 12.8*  HGB 16.3* 14.6  HCT 50.2* 44.9  PLT 279 229   BMET Recent Labs    01/13/21 1910 01/14/21 0445  NA 137 137  K 4.0 3.3*  CL 105 108  CO2 24 24  GLUCOSE 121* 99  BUN 9 6  CREATININE 0.70 0.62  CALCIUM 8.8* 7.9*   LFT Recent Labs    01/13/21 1910 01/14/21 0445  PROT 6.9 5.8*  ALBUMIN 4.2 3.4*  AST 15 11*  ALT 15 12  ALKPHOS 56 44  BILITOT 1.5* 1.5*     Studies/Results: CT Abdomen Pelvis W Contrast  Result Date: 01/13/2021 CLINICAL DATA:  Abdominal pain, nausea/vomiting/diarrhea, bradycardia EXAM: CT ABDOMEN AND PELVIS WITH CONTRAST TECHNIQUE: Multidetector CT imaging of the abdomen and pelvis was performed using the standard protocol following bolus administration of intravenous contrast. CONTRAST:  156mL OMNIPAQUE IOHEXOL 300 MG/ML  SOLN COMPARISON:  09/26/2018 FINDINGS: Lower chest: Lung bases are clear. Hepatobiliary: Liver is within normal limits. Gallbladder is unremarkable. No intrahepatic or extrahepatic ductal dilatation. Pancreas: Within normal limits. Spleen: Within normal limits. Adrenals/Urinary Tract: Adrenal glands are within normal limits. Kidneys are within normal limits.  No hydronephrosis. Bladder is within normal limits. Stomach/Bowel: Stomach is notable for  a tiny hiatal hernia. No evidence of bowel obstruction. Normal appendix (series 2/image 66). Wall thickening involving the ascending and transverse colon (series 2/image 42), suggesting infectious/inflammatory colitis. Cecum and rectosigmoid colon are preserved. Vascular/Lymphatic: No evidence of abdominal aortic aneurysm. No suspicious abdominopelvic  lymphadenopathy. Reproductive: Uterus is notable for an IUD in satisfactory position. Bilateral ovaries are within normal limits. Other: No abdominopelvic ascites. Musculoskeletal: Visualized osseous structures are within normal limits. IMPRESSION: Wall thickening involving the ascending and transverse colon, suggesting infectious/inflammatory colitis. Electronically Signed   By: Julian Hy M.D.   On: 01/13/2021 22:30    Impression: 35 y.o. year old female presenting to the ED after acute onset of N/V/D yesterday morning. Pain worsened in severity, prompting admission. CT abd/pelvis with contrast noted wall thickening involving ascending and transverse colon. GI now consulted due to colitis.   She has had no diarrhea since admission; therefore, stool studies unable to be collected. GI pathogen panel ordered. I have added Cdiff as this is not in the routine GI path panel. Agree with empiric antibiotic therapy. Sed rate normal. CRP still in process.   Interestingly, she has had evidence for prior inflammation on CT in Aug 2022 and notes chronic history of diarrhea. No rectal bleeding. Once recovered from acute illness, she will need diagnostic colonoscopy. However, if no improvement while inpatient, would need colonoscopy while admitted. Anticipate discharge in next 24 hours or so if continues to improve.     Plan: Collect GI path panel and Cdiff if any further diarrhea Clear liquids, advancing as tolerated to soft diet Plans for outpatient colonoscopy in 6 weeks after acute illness resolved No inpatient colonoscopy unless she does not clinically  improve Hopeful discharge in next 24 hours if continues to do well  Annitta Needs, PhD, ANP-BC [facility]     LOS: 0 days    01/14/2021, 11:16 AM

## 2021-01-14 NOTE — H&P (View-Only) (Signed)
Referring Provider: Dr. Cordelia Poche  Primary Care Physician:  Patient, No Pcp Per (Inactive) Primary Gastroenterologist:  previously unassigned, Dr. Abbey Chatters  Date of Admission: 01/13/21 Date of Consultation: 01/14/21  Reason for Consultation:  Colitis   HPI:  Kristen Bates is a 35 y.o. year old female presenting to the ED after acute onset of N/V/D yesterday morning. Pain worsened in severity, prompting admission. CT abd/pelvis with contrast noted wall thickening involving ascending and transverse colon. GI now consulted due to colitis.   Prior to admission: patient reports waking at 11 am yesterday with a cute N/V. Associated diarrhea. Had 8-10 loose stools. Last diarrhea prior to admission. N/V continued throughout the evening. Vomited after CT with contrast. No vomiting since that time. Abdominal pain located at umbilicus. Abdominal pain comes and goes. Crampy/sharp pain. No rectal bleeding.   Similar presentations have occurred on 2 other occasions in 2020 and then [facility] around 2018/2019. CT abd/pelvis with contrast in 2020 with submucosal fatty infiltration throughout colon suggesting chronic/prior inflammation.Marland Kitchen Has history of chronic stomach problems. Never has solid bowel movements most of the time. Usually 2-3 BMs per day. No rectal bleeding. Has started [employer] recently.  No known family history of IBD. Very distant relatives (maternal great aunts) with history of colon cancer. Mother has history of polyps requiring 5 year surveillance intervals.   Alcohol: 2-3 times per week. Drinks 2-3 wine coolers at a time. + Marijuana daily. No chronic N/V.   Past Medical History:  Diagnosis Date   Medical history non-contributory     Past Surgical History:  Procedure Laterality Date   CESAREAN SECTION     DENTAL SURGERY      Prior to Admission medications   Medication Sig Start Date End Date Taking? Authorizing Provider  ibuprofen (ADVIL) 400 MG tablet Take 400 mg by mouth  every 6 (six) hours as needed.   Yes [provider]  Multiple Vitamin (MULTIVITAMIN WITH MINERALS) TABS tablet Take 1 tablet by mouth daily.   Yes [provider]  naproxen sodium (ALEVE) 220 MG tablet Take 220 mg by mouth 2 (two) times daily as needed.   Yes [provider]  omeprazole (PRILOSEC) 20 MG capsule Take 1 capsule (20 mg total) by mouth daily. Patient taking differently: Take 20 mg by mouth daily as needed. 09/25/18  Yes Fredia Sorrow, MD  azithromycin (ZITHROMAX) 250 MG tablet Take 1 tablet (250 mg total) by mouth daily. Take one tablet daily starting on 09/15/13. Patient not taking: Reported on 01/13/2021 09/14/13   Evalee Jefferson, PA-C  benzonatate (TESSALON) 100 MG capsule Take 2 capsules (200 mg total) by mouth 3 (three) times daily as needed for cough. Patient not taking: Reported on 01/13/2021 09/14/13   Evalee Jefferson, PA-C  cephALEXin (KEFLEX) 500 MG capsule Take 1 capsule (500 mg total) by mouth 4 (four) times daily. Patient not taking: Reported on 01/13/2021 09/25/18   Fredia Sorrow, MD  dextromethorphan-guaiFENesin [facility] DM) 30-600 MG per 12 hr tablet Take 1 tablet by mouth 2 (two) times daily as needed for cough. Patient not taking: Reported on 01/13/2021    [provider]  levonorgestrel (MIRENA) 20 MCG/24HR IUD 1 each by Intrauterine route once.    [provider]  ondansetron (ZOFRAN ODT) 4 MG disintegrating tablet Take 1 tablet (4 mg total) by mouth every 8 (eight) hours as needed. Patient not taking: Reported on 01/13/2021 09/25/18   Fredia Sorrow, MD  predniSONE (DELTASONE) 10 MG tablet 6, 5, 4, 3,  2 then 1 tablet by mouth daily for 6 days total. Patient not taking: Reported on 01/13/2021 09/15/13   Evalee Jefferson, PA-C    Current Facility-Administered Medications  Medication Dose Route Frequency Provider Last Rate Last Admin   0.9 %  sodium chloride infusion   Intravenous Continuous Zierle-Ghosh, Asia B, DO 100 mL/hr at  01/14/21 0550 New Bag at 01/14/21 0550   acetaminophen (TYLENOL) tablet 650 mg  650 mg Oral Q6H PRN Zierle-Ghosh, Asia B, DO       Or   acetaminophen (TYLENOL) suppository 650 mg  650 mg Rectal Q6H PRN Zierle-Ghosh, Asia B, DO       ciprofloxacin (CIPRO) IVPB 400 mg  400 mg Intravenous Q12H Zierle-Ghosh, Asia B, DO 200 mL/hr at 01/14/21 0012 400 mg at 01/14/21 0012   heparin injection 5,000 Units  5,000 Units Subcutaneous Q8H Zierle-Ghosh, Asia B, DO   5,000 Units at 01/14/21 0553   loperamide (IMODIUM) capsule 2 mg  2 mg Oral PRN Zierle-Ghosh, Asia B, DO       metroNIDAZOLE (FLAGYL) IVPB 500 mg  500 mg Intravenous Q12H Zierle-Ghosh, Asia B, DO 100 mL/hr at 01/14/21 0135 500 mg at 01/14/21 0135   morphine 2 MG/ML injection 2 mg  2 mg Intravenous Q2H PRN Zierle-Ghosh, Asia B, DO       morphine 4 MG/ML injection 4 mg  4 mg Intravenous Once Zierle-Ghosh, Asia B, DO       ondansetron (ZOFRAN) injection 4 mg  4 mg Intravenous Q6H PRN Zierle-Ghosh, Asia B, DO       oxyCODONE (Oxy IR/ROXICODONE) immediate release tablet 5 mg  5 mg Oral Q4H PRN Zierle-Ghosh, Asia B, DO       pantoprazole (PROTONIX) injection 40 mg  40 mg Intravenous Q24H Zierle-Ghosh, Asia B, DO   40 mg at 01/14/21 0839   potassium chloride SA (KLOR-CON M) CR tablet 40 mEq  40 mEq Oral Q4H Mariel Aloe, MD       promethazine (PHENERGAN) 12.5 mg in sodium chloride 0.9 % 50 mL IVPB  12.5 mg Intravenous Q6H PRN Zierle-Ghosh, Asia B, DO        Allergies as of 01/13/2021   (No Known Allergies)    Family History  Problem Relation Age of Onset   Heart failure Mother    Colon polyps Mother    Diabetes Father    Colon cancer Neg Hx        no first degree relatives. Notes maternal great aunts with colon cancer   Inflammatory bowel disease Neg Hx     Social History   Socioeconomic History   Marital status: Single    Spouse name: Not on file   Number of children: Not on file   Years of education: Not on file   Highest education  level: Not on file  Occupational History   Not on file  Tobacco Use   Smoking status: Every Day    Packs/day: 1.00    Types: Cigarettes   Smokeless tobacco: Never  Substance and Sexual Activity   Alcohol use: Yes    Comment: several times per week. Usually 2-3 wine coolers at a time.   Drug use: Yes    Types: Marijuana    Comment: daily   Sexual activity: Yes    Birth control/protection: I.U.D.  Other Topics Concern   Not on file  Social History Narrative   Not on file   Social Determinants of Health   Financial Resource Strain: Not  on file  Food Insecurity: Not on file  Transportation Needs: Not on file  Physical Activity: Not on file  Stress: Not on file  Social Connections: Not on file  Intimate Partner Violence: Not on file    Review of Systems: Gen: Denies fever, chills, loss of appetite, change in weight or weight loss CV: Denies chest pain, heart palpitations, syncope, edema  Resp: Denies shortness of breath with rest, cough, wheezing GI: see HPI GU : Denies urinary burning, urinary frequency, urinary incontinence.  MS: Denies joint pain,swelling, cramping Derm: Denies rash, itching, dry skin Psych: Denies depression, anxiety,confusion, or memory loss Heme: Denies bruising, bleeding, and enlarged lymph nodes.  Physical Exam: Vital signs in last 24 hours: Temp:  [97.7 F (36.5 C)-98.9 F (37.2 C)] 98.3 F (36.8 C) (12/12 0754) Pulse Rate:  [45-99] 84 (12/12 0754) Resp:  [15-22] 17 (12/12 0754) BP: (102-173)/(55-97) 102/59 (12/12 0754) SpO2:  [96 %-100 %] 99 % (12/12 0754) Weight:  [118.7 kg-120.2 kg] 118.7 kg (12/11 2320) Last BM Date: 01/13/21 General:   Alert,  Well-developed, well-nourished, pleasant and cooperative in NAD Head:  Normocephalic and atraumatic. Eyes:  Sclera clear, no icterus.   Conjunctiva pink. Ears:  Normal auditory acuity. Nose:  No deformity, discharge,  or lesions. Lungs:  Clear throughout to auscultation.    Heart:  S1 S2  present; no murmurs, clicks, rubs,  or gallops. Abdomen:  Soft, TTP at umbilicus, lower abdomen, and LLQ and nondistended. No masses, hepatosplenomegaly or hernias noted. Normal bowel sounds, without guarding, and without rebound.   Rectal:  Deferred until time of colonoscopy.   Msk:  Symmetrical without gross deformities. Normal posture. Extremities:  Without edema. Neurologic:  Alert and  oriented x4 Psych:  Alert and cooperative. Normal mood and affect.  Intake/Output from previous day: 12/11 0701 - 12/12 0700 In: 315.1 [I.V.:15.1; IV Piggyback:300] Out: -  Intake/Output this shift: No intake/output data recorded.  Lab Results: Recent Labs    01/13/21 1910 01/14/21 0445  WBC 12.3* 12.8*  HGB 16.3* 14.6  HCT 50.2* 44.9  PLT 279 229   BMET Recent Labs    01/13/21 1910 01/14/21 0445  NA 137 137  K 4.0 3.3*  CL 105 108  CO2 24 24  GLUCOSE 121* 99  BUN 9 6  CREATININE 0.70 0.62  CALCIUM 8.8* 7.9*   LFT Recent Labs    01/13/21 1910 01/14/21 0445  PROT 6.9 5.8*  ALBUMIN 4.2 3.4*  AST 15 11*  ALT 15 12  ALKPHOS 56 44  BILITOT 1.5* 1.5*     Studies/Results: CT Abdomen Pelvis W Contrast  Result Date: 01/13/2021 CLINICAL DATA:  Abdominal pain, nausea/vomiting/diarrhea, bradycardia EXAM: CT ABDOMEN AND PELVIS WITH CONTRAST TECHNIQUE: Multidetector CT imaging of the abdomen and pelvis was performed using the standard protocol following bolus administration of intravenous contrast. CONTRAST:  133mL OMNIPAQUE IOHEXOL 300 MG/ML  SOLN COMPARISON:  09/26/2018 FINDINGS: Lower chest: Lung bases are clear. Hepatobiliary: Liver is within normal limits. Gallbladder is unremarkable. No intrahepatic or extrahepatic ductal dilatation. Pancreas: Within normal limits. Spleen: Within normal limits. Adrenals/Urinary Tract: Adrenal glands are within normal limits. Kidneys are within normal limits.  No hydronephrosis. Bladder is within normal limits. Stomach/Bowel: Stomach is notable for  a tiny hiatal hernia. No evidence of bowel obstruction. Normal appendix (series 2/image 66). Wall thickening involving the ascending and transverse colon (series 2/image 42), suggesting infectious/inflammatory colitis. Cecum and rectosigmoid colon are preserved. Vascular/Lymphatic: No evidence of abdominal aortic aneurysm. No suspicious abdominopelvic  lymphadenopathy. Reproductive: Uterus is notable for an IUD in satisfactory position. Bilateral ovaries are within normal limits. Other: No abdominopelvic ascites. Musculoskeletal: Visualized osseous structures are within normal limits. IMPRESSION: Wall thickening involving the ascending and transverse colon, suggesting infectious/inflammatory colitis. Electronically Signed   By: Julian Hy M.D.   On: 01/13/2021 22:30    Impression: 35 y.o. year old female presenting to the ED after acute onset of N/V/D yesterday morning. Pain worsened in severity, prompting admission. CT abd/pelvis with contrast noted wall thickening involving ascending and transverse colon. GI now consulted due to colitis.   She has had no diarrhea since admission; therefore, stool studies unable to be collected. GI pathogen panel ordered. I have added Cdiff as this is not in the routine GI path panel. Agree with empiric antibiotic therapy. Sed rate normal. CRP still in process.   Interestingly, she has had evidence for prior inflammation on CT in Aug 2022 and notes chronic history of diarrhea. No rectal bleeding. Once recovered from acute illness, she will need diagnostic colonoscopy. However, if no improvement while inpatient, would need colonoscopy while admitted. Anticipate discharge in next 24 hours or so if continues to improve.     Plan: Collect GI path panel and Cdiff if any further diarrhea Clear liquids, advancing as tolerated to soft diet Plans for outpatient colonoscopy in 6 weeks after acute illness resolved No inpatient colonoscopy unless she does not clinically  improve Hopeful discharge in next 24 hours if continues to do well  Annitta Needs, PhD, ANP-BC [facility]     LOS: 0 days    01/14/2021, 11:16 AM

## 2021-01-14 NOTE — Progress Notes (Addendum)
   Patient seen and examined at bedside, patient admitted after midnight, please see earlier detailed admission note by Green[place] B Zierle-Ghosh, DO. Briefly, patient presented secondary to intractable nausea/vomiting and diarrhea with evidence of colitis on imaging.  Subjective: Feeling better from earlier. No diarrhea overnight and no nausea yet. She has not taken anything by mouth, however.  BP (!) 102/59 (BP Location: Left Arm)   Pulse 84   Temp 98.3 F (36.8 C) (Oral)   Resp 17   Ht 5\' 4"  (1.626 m)   Wt 118.7 kg   SpO2 99%   BMI 44.92 kg/m   General exam: Appears calm and comfortable  Respiratory system: Respiratory effort normal. Gastrointestinal system: Abdomen is nondistended, soft and mildly tender. No organomegaly or masses felt. Normal bowel sounds heard. Central nervous system: Alert and oriented. No focal neurological deficits. Musculoskeletal: No edema. No calf tenderness Skin: No cyanosis. No rashes Psychiatry: Judgement and insight appear normal. Mood & affect appropriate.   Brief assessment/Plan:  Colitis Possibly infectious vs inflammatory. GI pathogen panel pending. Patient started empirically on Ciprofloxacin and Flagyl; mild leukocytosis. History of recurrent bowel illness -GI consult -Zofran prn -GI pathogen panel pending -Clear liquid diet; IV fluids until tolerating oral intake more consistently  Nausea/vomiting Likely secondary to above  Marijuana use Counseled on admission  Tobacco use Counseled on admission. Nicotine patch declined.  Hypokalemia -Potassium supplementation  Ketonuria Likely starvation ketosis secondary to nausea/vomiting symptoms.  Family communication: Mother at bedside DVT prophylaxis: Heparin subq Disposition: Discharge home likely in 24 hours pending infectious workup and GI recommendations.  , MD [facility] 01/14/2021, 11:29 AM

## 2021-01-14 NOTE — Discharge Summary (Incomplete)
Physician Discharge Summary  KIP MCINERNY [id number] DOB: [birth year] DOA: 01/13/2021  PCP: Patient, No Pcp Per (Inactive)  Admit date: 01/13/2021 Discharge date: 01/14/2021***  Admitted From: *** Disposition: ***  Recommendations for Outpatient Follow-up:  Follow up with PCP in 1 week*** Please obtain BMP/CBC in one week*** Please follow up on the following pending results: ***  Home Health: *** Equipment/Devices: ***  Discharge Condition: *** CODE STATUS: *** Diet recommendation: ***   Brief/Interim Summary:  Admission HPI written by [place], DO   ***    Hospital course:  ***  Discharge Diagnoses:  Principal Problem:   Colitis    Discharge Instructions   Allergies as of 01/14/2021   No Known Allergies   Med Rec must be completed prior to using this [place]***       No Known Allergies  Consultations: ***   Procedures/Studies: CT Abdomen Pelvis W Contrast  Result Date: 01/13/2021 CLINICAL DATA:  Abdominal pain, nausea/vomiting/diarrhea, bradycardia EXAM: CT ABDOMEN AND PELVIS WITH CONTRAST TECHNIQUE: Multidetector CT imaging of the abdomen and pelvis was performed using the standard protocol following bolus administration of intravenous contrast. CONTRAST:  141mL OMNIPAQUE IOHEXOL 300 MG/ML  SOLN COMPARISON:  09/26/2018 FINDINGS: Lower chest: Lung bases are clear. Hepatobiliary: Liver is within normal limits. Gallbladder is unremarkable. No intrahepatic or extrahepatic ductal dilatation. Pancreas: Within normal limits. Spleen: Within normal limits. Adrenals/Urinary Tract: Adrenal glands are within normal limits. Kidneys are within normal limits.  No hydronephrosis. Bladder is within normal limits. Stomach/Bowel: Stomach is notable for a tiny hiatal hernia. No evidence of bowel obstruction. Normal appendix (series 2/image 66). Wall thickening involving the ascending and transverse colon (series 2/image 42), suggesting  infectious/inflammatory colitis. Cecum and rectosigmoid colon are preserved. Vascular/Lymphatic: No evidence of abdominal aortic aneurysm. No suspicious abdominopelvic lymphadenopathy. Reproductive: Uterus is notable for an IUD in satisfactory position. Bilateral ovaries are within normal limits. Other: No abdominopelvic ascites. Musculoskeletal: Visualized osseous structures are within normal limits. IMPRESSION: Wall thickening involving the ascending and transverse colon, suggesting infectious/inflammatory colitis. Electronically Signed   By: Julian Hy M.D.   On: 01/13/2021 22:30    ***   Subjective: ***  Discharge Exam: Vitals:   01/14/21 0552 01/14/21 0754  BP: (!) 104/55 (!) 102/59  Pulse: 74 84  Resp: 15 17  Temp: 97.9 F (36.6 C) 98.3 F (36.8 C)  SpO2: 99% 99%   Vitals:   01/13/21 2320 01/13/21 2352 01/14/21 0552 01/14/21 0754  BP:  (!) 155/97 (!) 104/55 (!) 102/59  Pulse:  64 74 84  Resp:  20 15 17   Temp:  98.9 F (37.2 C) 97.9 F (36.6 C) 98.3 F (36.8 C)  TempSrc:  Oral  Oral  SpO2:  100% 99% 99%  Weight: 118.7 kg     Height: 5\' 4"  (1.626 m)       General: Pt is alert, awake, not in acute distress*** Cardiovascular: RRR, S1/S2 +, no rubs, no gallops Respiratory: CTA bilaterally, no wheezing, no rhonchi Abdominal: Soft, NT, ND, bowel sounds + Extremities: no edema, no cyanosis    The results of significant diagnostics from this hospitalization (including imaging, microbiology, ancillary and laboratory) are listed below for reference.     Microbiology: Recent Results (from the past 240 hour(s))  Resp Panel by RT-PCR (Flu A&B, Covid) Nasopharyngeal Swab     Status: None   Collection Time: 01/13/21  7:26 PM   Specimen: Nasopharyngeal Swab; Nasopharyngeal(NP) swabs in vial transport medium  Result Value Ref  Range Status   SARS Coronavirus 2 by RT PCR NEGATIVE NEGATIVE Final    Comment: (NOTE) SARS-CoV-2 target nucleic acids are NOT DETECTED.  The  SARS-CoV-2 RNA is generally detectable in upper respiratory specimens during the acute phase of infection. The lowest concentration of SARS-CoV-2 viral copies this assay can detect is 138 copies/mL. A negative result does not preclude SARS-Cov-2 infection and should not be used as the sole basis for treatment or other patient management decisions. A negative result may occur with  improper specimen collection/handling, submission of specimen other than nasopharyngeal swab, presence of viral mutation(s) within the areas targeted by this assay, and inadequate number of viral copies(<138 copies/mL). A negative result must be combined with clinical observations, patient history, and epidemiological information. The expected result is Negative.  Fact Sheet for Patients:  EntrepreneurPulse.com.au  Fact Sheet for Healthcare Providers:  IncredibleEmployment.be  This test is no t yet approved or cleared by the [place] FDA and  has been authorized for detection and/or diagnosis of SARS-CoV-2 by FDA under an Emergency Use Authorization (EUA). This EUA will remain  in effect (meaning this test can be used) for the duration of the COVID-19 declaration under Section 564(b)(1) of the Act, 21 U.S.C.section 360bbb-3(b)(1), unless the authorization is terminated  or revoked sooner.       Influenza A by PCR NEGATIVE NEGATIVE Final   Influenza B by PCR NEGATIVE NEGATIVE Final    Comment: (NOTE) The Xpert Xpress SARS-CoV-2/FLU/RSV plus assay is intended as an aid in the diagnosis of influenza from Nasopharyngeal swab specimens and should not be used as a sole basis for treatment. Nasal washings and aspirates are unacceptable for Xpert Xpress SARS-CoV-2/FLU/RSV testing.  Fact Sheet for Patients: EntrepreneurPulse.com.au  Fact Sheet for Healthcare Providers: IncredibleEmployment.be  This test is not yet approved or  cleared by the [place] FDA and has been authorized for detection and/or diagnosis of SARS-CoV-2 by FDA under an Emergency Use Authorization (EUA). This EUA will remain in effect (meaning this test can be used) for the duration of the COVID-19 declaration under Section 564(b)(1) of the Act, 21 U.S.C. section 360bbb-3(b)(1), unless the authorization is terminated or revoked.  Performed at [facility], [address], [place], [place] [address]      Labs: BNP (last 3 results) No results for input(s): BNP in the last 8760 hours. Basic Metabolic Panel: Recent Labs  Lab 01/13/21 1910 01/14/21 0445  NA 137 137  K 4.0 3.3*  CL 105 108  CO2 24 24  GLUCOSE 121* 99  BUN 9 6  CREATININE 0.70 0.62  CALCIUM 8.8* 7.9*  MG  --  1.5*   Liver Function Tests: Recent Labs  Lab 01/13/21 1910 01/14/21 0445  AST 15 11*  ALT 15 12  ALKPHOS 56 44  BILITOT 1.5* 1.5*  PROT 6.9 5.8*  ALBUMIN 4.2 3.4*   Recent Labs  Lab 01/13/21 1910  LIPASE 39   No results for input(s): AMMONIA in the last 168 hours. CBC: Recent Labs  Lab 01/13/21 1910 01/14/21 0445  WBC 12.3* 12.8*  NEUTROABS  --  9.4*  HGB 16.3* 14.6  HCT 50.2* 44.9  MCV 98.4 98.7  PLT 279 229   Cardiac Enzymes: No results for input(s): CKTOTAL, CKMB, CKMBINDEX, TROPONINI in the last 168 hours. BNP: Invalid input(s): POCBNP CBG: No results for input(s): GLUCAP in the last 168 hours. D-Dimer No results for input(s): DDIMER in the last 72 hours. Hgb A1c No results for input(s): HGBA1C in  the last 72 hours. Lipid Profile No results for input(s): CHOL, HDL, LDLCALC, TRIG, CHOLHDL, LDLDIRECT in the last 72 hours. Thyroid function studies No results for input(s): TSH, T4TOTAL, T3FREE, THYROIDAB in the last 72 hours.  Invalid input(s): FREET3 Anemia work up No results for input(s): VITAMINB12, FOLATE, FERRITIN, TIBC, IRON, RETICCTPCT in the last 72 hours. Urinalysis    Component Value Date/Time   COLORURINE  YELLOW 01/13/2021 2310   APPEARANCEUR CLEAR 01/13/2021 2310   LABSPEC 1.010 01/13/2021 2310   PHURINE 5.5 01/13/2021 2310   GLUCOSEU NEGATIVE 01/13/2021 2310   HGBUR SMALL (A) 01/13/2021 2310   BILIRUBINUR NEGATIVE 01/13/2021 2310   KETONESUR >80 (A) 01/13/2021 2310   PROTEINUR NEGATIVE 01/13/2021 2310   UROBILINOGEN 0.2 11/29/2011 1054   NITRITE NEGATIVE 01/13/2021 2310   LEUKOCYTESUR NEGATIVE 01/13/2021 2310   Sepsis Labs Invalid input(s): PROCALCITONIN,  WBC,  LACTICIDVEN Microbiology Recent Results (from the past 240 hour(s))  Resp Panel by RT-PCR (Flu A&B, Covid) Nasopharyngeal Swab     Status: None   Collection Time: 01/13/21  7:26 PM   Specimen: Nasopharyngeal Swab; Nasopharyngeal(NP) swabs in vial transport medium  Result Value Ref Range Status   SARS Coronavirus 2 by RT PCR NEGATIVE NEGATIVE Final    Comment: (NOTE) SARS-CoV-2 target nucleic acids are NOT DETECTED.  The SARS-CoV-2 RNA is generally detectable in upper respiratory specimens during the acute phase of infection. The lowest concentration of SARS-CoV-2 viral copies this assay can detect is 138 copies/mL. A negative result does not preclude SARS-Cov-2 infection and should not be used as the sole basis for treatment or other patient management decisions. A negative result may occur with  improper specimen collection/handling, submission of specimen other than nasopharyngeal swab, presence of viral mutation(s) within the areas targeted by this assay, and inadequate number of viral copies(<138 copies/mL). A negative result must be combined with clinical observations, patient history, and epidemiological information. The expected result is Negative.  Fact Sheet for Patients:  EntrepreneurPulse.com.au  Fact Sheet for Healthcare Providers:  IncredibleEmployment.be  This test is no t yet approved or cleared by the [place] FDA and  has been authorized for detection  and/or diagnosis of SARS-CoV-2 by FDA under an Emergency Use Authorization (EUA). This EUA will remain  in effect (meaning this test can be used) for the duration of the COVID-19 declaration under Section 564(b)(1) of the Act, 21 U.S.C.section 360bbb-3(b)(1), unless the authorization is terminated  or revoked sooner.       Influenza A by PCR NEGATIVE NEGATIVE Final   Influenza B by PCR NEGATIVE NEGATIVE Final    Comment: (NOTE) The Xpert Xpress SARS-CoV-2/FLU/RSV plus assay is intended as an aid in the diagnosis of influenza from Nasopharyngeal swab specimens and should not be used as a sole basis for treatment. Nasal washings and aspirates are unacceptable for Xpert Xpress SARS-CoV-2/FLU/RSV testing.  Fact Sheet for Patients: EntrepreneurPulse.com.au  Fact Sheet for Healthcare Providers: IncredibleEmployment.be  This test is not yet approved or cleared by the [place] FDA and has been authorized for detection and/or diagnosis of SARS-CoV-2 by FDA under an Emergency Use Authorization (EUA). This EUA will remain in effect (meaning this test can be used) for the duration of the COVID-19 declaration under Section 564(b)(1) of the Act, 21 U.S.C. section 360bbb-3(b)(1), unless the authorization is terminated or revoked.  Performed at [facility], [address] [place], [place] [address]      Time coordinating discharge: 35 minutes***  SIGNED:   Cordelia Poche, MD  [facility] 01/14/2021, 11:00 AM

## 2021-01-14 NOTE — H&P (Signed)
TRH H&P    Patient Demographics:    Kristen Bates, is a 35 y.o. female  MRN: [id number]  DOB - 03/08/1985  Admit Date - 01/13/2021  Referring MD/NP/PA: [email or web address]  Outpatient Primary MD for the patient is Patient, No Pcp Per (Inactive)  Patient coming from: Home  Chief complaint- intractable nausea, vomiting and diarrhea   HPI:    Kristen Bates  is a 35 y.o. female, with history of tobacco use disorder and marijuana use presents ED with a chief complaint of intractable nausea, vomiting, diarrhea.  Patient reports that the day prior to presentation at 9 AM she woke up with abdominal pain and had a loose bowel movement.  She went back to sleep.  At 11 AM she woke up with vomiting and diarrhea.  She describes her abdominal pain is periumbilical and intermittent.  It is crampy.  There are no current provoking factors.  The pain is a little better after a bowel movement.  During the bowel movement she has increased pain, and breaks out into a cold sweat.  Patient reports no sick contacts.  Her emesis and her diarrhea are nonbloody.  She has had to many episodes to count.  She has had no p.o. intake since it started.  Patient has no family history of Crohn's or ulcerative colitis.  Patient has no history of cyclic vomiting syndrome.  She has no recent travel.  Patient has no other complaints at this time.  Patient is a current smoker and declines nicotine patch at this time.  She drinks about 2-3 times a week and has never had withdrawals.  She uses marijuana every day.  Patient is not vaccinated for COVID.  Patient is full code.  In the ED Temp 98, heart rate 45-99, respiratory rate 17-22, blood pressure 145/94, satting at 96% Patient is a leukocytosis of 12.3, hemoglobin 16.3 She has an elevated T bili 1.5, chemistry panel is otherwise unremarkable Neg pregnancy test Neg ovid and flu CT abd/pel = infectious colitis EKG  shows sinus brady with HR 44, QTC 416 Patient was given pepcid, benadryl, morphine, compazine, phenergan, 2 L Bolus in the ED She was still nauseous, and would not be able to tolerate PO antibiotics, so admission was requested for the management of colitis with IV antibiotics.     Review of systems:    In addition to the HPI above,  No Fever-chills, No Headache, No changes with Vision or hearing, No problems swallowing food or Liquids, No Chest pain, Cough or Shortness of Breath, No Blood in stool or Urine, No dysuria, No new skin rashes or bruises, No new joints pains-aches,  No new weakness, tingling, numbness in any extremity, No recent weight gain or loss, No polyuria, polydypsia or polyphagia, No significant Mental Stressors.  All other systems reviewed and are negative.    Past History of the following :    History reviewed. No pertinent past medical history.    Past Surgical History:  Procedure Laterality Date   CESAREAN SECTION     DENTAL SURGERY  Social History:      Social History   Tobacco Use   Smoking status: Every Day    Packs/day: 1.00    Types: Cigarettes   Smokeless tobacco: Never  Substance Use Topics   Alcohol use: Yes    Comment: occ       Family History :     Family History  Problem Relation Age of Onset   Heart failure Mother    Diabetes Father       Home Medications:   Prior to Admission medications   Medication Sig Start Date End Date Taking? Authorizing Provider  ibuprofen (ADVIL) 400 MG tablet Take 400 mg by mouth every 6 (six) hours as needed.   Yes [provider]  Multiple Vitamin (MULTIVITAMIN WITH MINERALS) TABS tablet Take 1 tablet by mouth daily.   Yes [provider]  naproxen sodium (ALEVE) 220 MG tablet Take 220 mg by mouth 2 (two) times daily as needed.   Yes [provider]  omeprazole (PRILOSEC) 20 MG capsule Take 1 capsule (20 mg total) by mouth daily. Patient taking  differently: Take 20 mg by mouth daily as needed. 09/25/18  Yes Fredia Sorrow, MD  azithromycin (ZITHROMAX) 250 MG tablet Take 1 tablet (250 mg total) by mouth daily. Take one tablet daily starting on 09/15/13. Patient not taking: Reported on 01/13/2021 09/14/13   Evalee Jefferson, PA-C  benzonatate (TESSALON) 100 MG capsule Take 2 capsules (200 mg total) by mouth 3 (three) times daily as needed for cough. Patient not taking: Reported on 01/13/2021 09/14/13   Evalee Jefferson, PA-C  cephALEXin (KEFLEX) 500 MG capsule Take 1 capsule (500 mg total) by mouth 4 (four) times daily. Patient not taking: Reported on 01/13/2021 09/25/18   Fredia Sorrow, MD  dextromethorphan-guaiFENesin [facility]) 30-600 MG per 12 hr tablet Take 1 tablet by mouth 2 (two) times daily as needed for cough. Patient not taking: Reported on 01/13/2021    [provider]  levonorgestrel (MIRENA) 20 MCG/24HR IUD 1 each by Intrauterine route once.    [provider]  ondansetron (ZOFRAN ODT) 4 MG disintegrating tablet Take 1 tablet (4 mg total) by mouth every 8 (eight) hours as needed. Patient not taking: Reported on 01/13/2021 09/25/18   Fredia Sorrow, MD  predniSONE (DELTASONE) 10 MG tablet 6, 5, 4, 3, 2 then 1 tablet by mouth daily for 6 days total. Patient not taking: Reported on 01/13/2021 09/15/13   Evalee Jefferson, PA-C     Allergies:    No Known Allergies   Physical Exam:   Vitals  Blood pressure (!) 155/97, pulse 64, temperature 98.9 F (37.2 C), temperature source Oral, resp. rate 20, height 5\' 4"  (1.626 m), weight 118.7 kg, SpO2 100 %.   1.  General: Patient lying supine in bed,  no acute distress   2. Psychiatric: Somnolent (2/2 to meds) and oriented x 3, mood and behavior normal for situation, pleasant and cooperative with exam   3. Neurologic: Speech and language are normal, face is symmetric, moves all 4 extremities voluntarily, at baseline without acute deficits on limited exam   4. HEENMT:   Head is atraumatic, normocephalic, pupils reactive to light, neck is supple, trachea is midline, mucous membranes are moist   5. Respiratory : Lungs are clear to auscultation bilaterally without wheezing, rhonchi, rales, no cyanosis, no increase in work of breathing or accessory muscle use   6. Cardiovascular : Heart rate normal, rhythm is regular, no murmurs, rubs or gallops, no peripheral  edema, peripheral pulses palpated   7. Gastrointestinal:  Abdomen is soft, nondistended, diffusely tender to palpation, and palpation provoked urgency to have BM, bowel sounds active, no masses or organomegaly palpated   8. Skin:  Skin is warm, dry and intact without rashes, acute lesions, or ulcers on limited exam   9.Musculoskeletal:  No acute deformities or trauma, no asymmetry in tone, no peripheral edema, peripheral pulses palpated, no tenderness to palpation in the extremities     Data Review:    CBC Recent Labs  Lab 01/13/21 1910  WBC 12.3*  HGB 16.3*  HCT 50.2*  PLT 279  MCV 98.4  MCH 32.0  MCHC 32.5  RDW 12.5   ------------------------------------------------------------------------------------------------------------------  Results for orders placed or performed during the hospital encounter of 01/13/21 (from the past 48 hour(s))  Lipase, blood     Status: None   Collection Time: 01/13/21  7:10 PM  Result Value Ref Range   Lipase 39 11 - 51 U/L    Comment: Performed at [facility], [address] [place], [place] [address]  Comprehensive metabolic panel     Status: Abnormal   Collection Time: 01/13/21  7:10 PM  Result Value Ref Range   Sodium 137 135 - 145 mmol/L   Potassium 4.0 3.5 - 5.1 mmol/L   Chloride 105 98 - 111 mmol/L   CO2 24 22 - 32 mmol/L   Glucose, Bld 121 (H) 70 - 99 mg/dL    Comment: Glucose reference range applies only to samples taken after fasting for at least 8 hours.   BUN 9 6 - 20 mg/dL   Creatinine, Ser 0.70 0.44 - 1.00 mg/dL   Calcium 8.8 (L)  8.9 - 10.3 mg/dL   Total Protein 6.9 6.5 - 8.1 g/dL   Albumin 4.2 3.5 - 5.0 g/dL   AST 15 15 - 41 U/L   ALT 15 0 - 44 U/L   Alkaline Phosphatase 56 38 - 126 U/L   Total Bilirubin 1.5 (H) 0.3 - 1.2 mg/dL   GFR, Estimated >60 >60 mL/min    Comment: (NOTE) Calculated using the CKD-EPI Creatinine Equation (2021)    Anion gap 8 5 - 15    Comment: Performed at [facility], [address], [place], [place] [address]  CBC     Status: Abnormal   Collection Time: 01/13/21  7:10 PM  Result Value Ref Range   WBC 12.3 (H) 4.0 - 10.5 K/uL   RBC 5.10 3.87 - 5.11 MIL/uL   Hemoglobin 16.3 (H) 12.0 - 15.0 g/dL   HCT 50.2 (H) 36.0 - 46.0 %   MCV 98.4 80.0 - 100.0 fL   MCH 32.0 26.0 - 34.0 pg   MCHC 32.5 30.0 - 36.0 g/dL   RDW 12.5 11.5 - 15.5 %   Platelets 279 150 - 400 K/uL   nRBC 0.0 0.0 - 0.2 %    Comment: Performed at [facility], [address] [place], [place] [address]  hCG, serum, qualitative     Status: None   Collection Time: 01/13/21  7:11 PM  Result Value Ref Range   Preg, Serum NEGATIVE NEGATIVE    Comment:        THE SENSITIVITY OF THIS METHODOLOGY IS >10 mIU/mL. Performed at [facility], [address] [place],  [address]   Resp Panel by RT-PCR (Flu A&B, Covid) Nasopharyngeal Swab     Status: None   Collection Time: 01/13/21  7:26 PM   Specimen: Nasopharyngeal Swab; Nasopharyngeal(NP) swabs  in vial transport medium  Result Value Ref Range   SARS Coronavirus 2 by RT PCR NEGATIVE NEGATIVE    Comment: (NOTE) SARS-CoV-2 target nucleic acids are NOT DETECTED.  The SARS-CoV-2 RNA is generally detectable in upper respiratory specimens during the acute phase of infection. The lowest concentration of SARS-CoV-2 viral copies this assay can detect is 138 copies/mL. A negative result does not preclude SARS-Cov-2 infection and should not be used as the sole basis for treatment or other patient management decisions. A negative result may occur with  improper specimen  collection/handling, submission of specimen other than nasopharyngeal swab, presence of viral mutation(s) within the areas targeted by this assay, and inadequate number of viral copies(<138 copies/mL). A negative result must be combined with clinical observations, patient history, and epidemiological information. The expected result is Negative.  Fact Sheet for Patients:  EntrepreneurPulse.com.au  Fact Sheet for Healthcare Providers:  IncredibleEmployment.be  This test is no t yet approved or cleared by the [place] FDA and  has been authorized for detection and/or diagnosis of SARS-CoV-2 by FDA under an Emergency Use Authorization (EUA). This EUA will remain  in effect (meaning this test can be used) for the duration of the COVID-19 declaration under Section 564(b)(1) of the Act, 21 U.S.C.section 360bbb-3(b)(1), unless the authorization is terminated  or revoked sooner.       Influenza A by PCR NEGATIVE NEGATIVE   Influenza B by PCR NEGATIVE NEGATIVE    Comment: (NOTE) The Xpert Xpress SARS-CoV-2/FLU/RSV plus assay is intended as an aid in the diagnosis of influenza from Nasopharyngeal swab specimens and should not be used as a sole basis for treatment. Nasal washings and aspirates are unacceptable for Xpert Xpress SARS-CoV-2/FLU/RSV testing.  Fact Sheet for Patients: EntrepreneurPulse.com.au  Fact Sheet for Healthcare Providers: IncredibleEmployment.be  This test is not yet approved or cleared by the [place] FDA and has been authorized for detection and/or diagnosis of SARS-CoV-2 by FDA under an Emergency Use Authorization (EUA). This EUA will remain in effect (meaning this test can be used) for the duration of the COVID-19 declaration under Section 564(b)(1) of the Act, 21 U.S.C. section 360bbb-3(b)(1), unless the authorization is terminated or revoked.  Performed at [facility],  [address] [place], [place] [address]   Urinalysis, Routine w reflex microscopic     Status: Abnormal   Collection Time: 01/13/21 11:10 PM  Result Value Ref Range   Color, Urine YELLOW YELLOW   APPearance CLEAR CLEAR   Specific Gravity, Urine 1.010 1.005 - 1.030   pH 5.5 5.0 - 8.0   Glucose, UA NEGATIVE NEGATIVE mg/dL   Hgb urine dipstick SMALL (A) NEGATIVE   Bilirubin Urine NEGATIVE NEGATIVE   Ketones, ur >80 (A) NEGATIVE mg/dL   Protein, ur NEGATIVE NEGATIVE mg/dL   Nitrite NEGATIVE NEGATIVE   Leukocytes,Ua NEGATIVE NEGATIVE    Comment: Performed at [facility], [address], [place], [place] [address]  Urinalysis, Microscopic (reflex)     Status: None   Collection Time: 01/13/21 11:10 PM  Result Value Ref Range   RBC / HPF 0-5 0 - 5 RBC/hpf   WBC, UA 0-5 0 - 5 WBC/hpf   Bacteria, UA NONE SEEN NONE SEEN   Squamous Epithelial / LPF 0-5 0 - 5    Comment: Performed at [facility], [address] [place], [place] [address]    Chemistries  Recent Labs  Lab 01/13/21 1910  NA 137  K 4.0  CL 105  CO2 24  GLUCOSE 121*  BUN 9  CREATININE 0.70  CALCIUM 8.8*  AST 15  ALT 15  ALKPHOS 56  BILITOT 1.5*   ------------------------------------------------------------------------------------------------------------------  ------------------------------------------------------------------------------------------------------------------ GFR: Estimated Creatinine Clearance: 122.1 mL/min (by C-G formula based on SCr of 0.7 mg/dL). Liver Function Tests: Recent Labs  Lab 01/13/21 1910  AST 15  ALT 15  ALKPHOS 56  BILITOT 1.5*  PROT 6.9  ALBUMIN 4.2   Recent Labs  Lab 01/13/21 1910  LIPASE 39   No results for input(s): AMMONIA in the last 168 hours. Coagulation Profile: No results for input(s): INR, PROTIME in the last 168 hours. Cardiac Enzymes: No results for input(s): CKTOTAL, CKMB, CKMBINDEX, TROPONINI in the last 168 hours. BNP (last 3 results) No results for  input(s): PROBNP in the last 8760 hours. HbA1C: No results for input(s): HGBA1C in the last 72 hours. CBG: No results for input(s): GLUCAP in the last 168 hours. Lipid Profile: No results for input(s): CHOL, HDL, LDLCALC, TRIG, CHOLHDL, LDLDIRECT in the last 72 hours. Thyroid Function Tests: No results for input(s): TSH, T4TOTAL, FREET4, T3FREE, THYROIDAB in the last 72 hours. Anemia Panel: No results for input(s): VITAMINB12, FOLATE, FERRITIN, TIBC, IRON, RETICCTPCT in the last 72 hours.  --------------------------------------------------------------------------------------------------------------- Urine analysis:    Component Value Date/Time   COLORURINE YELLOW 01/13/2021 2310   APPEARANCEUR CLEAR 01/13/2021 2310   LABSPEC 1.010 01/13/2021 2310   PHURINE 5.5 01/13/2021 2310   GLUCOSEU NEGATIVE 01/13/2021 2310   HGBUR SMALL (A) 01/13/2021 2310   BILIRUBINUR NEGATIVE 01/13/2021 2310   KETONESUR >80 (A) 01/13/2021 2310   PROTEINUR NEGATIVE 01/13/2021 2310   UROBILINOGEN 0.2 11/29/2011 1054   NITRITE NEGATIVE 01/13/2021 2310   LEUKOCYTESUR NEGATIVE 01/13/2021 2310      Imaging Results:    CT Abdomen Pelvis W Contrast  Result Date: 01/13/2021 CLINICAL DATA:  Abdominal pain, nausea/vomiting/diarrhea, bradycardia EXAM: CT ABDOMEN AND PELVIS WITH CONTRAST TECHNIQUE: Multidetector CT imaging of the abdomen and pelvis was performed using the standard protocol following bolus administration of intravenous contrast. CONTRAST:  OMNIPAQUE IOHEXOL 300 MG/ML  SOLN COMPARISON:  09/26/2018 FINDINGS: Lower chest: Lung bases are clear. Hepatobiliary: Liver is within normal limits. Gallbladder is unremarkable. No intrahepatic or extrahepatic ductal dilatation. Pancreas: Within normal limits. Spleen: Within normal limits. Adrenals/Urinary Tract: Adrenal glands are within normal limits. Kidneys are within normal limits.  No hydronephrosis. Bladder is within normal limits. Stomach/Bowel: Stomach  is notable for a tiny hiatal hernia. No evidence of bowel obstruction. Normal appendix (series 2/image 66). Wall thickening involving the ascending and transverse colon (series 2/image 42), suggesting infectious/inflammatory colitis. Cecum and rectosigmoid colon are preserved. Vascular/Lymphatic: No evidence of abdominal aortic aneurysm. No suspicious abdominopelvic lymphadenopathy. Reproductive: Uterus is notable for an IUD in satisfactory position. Bilateral ovaries are within normal limits. Other: No abdominopelvic ascites. Musculoskeletal: Visualized osseous structures are within normal limits. IMPRESSION: Wall thickening involving the ascending and transverse colon, suggesting infectious/inflammatory colitis. Electronically Signed   By: Charline Bills M.D.   On: 01/13/2021 22:30       Assessment & Plan:    Principal Problem:   Colitis   Colitis Continue cipro and flagyl Continue supportive care GI pathogens pending Continue to monitor Tobacco use disorder Counseled on importance of cessation Declining nicotine patch at this time Marijuana use Counseled on importance of cessation Ketonuria > 80 ketones 2/2 GI illness and poor PO intake     DVT Prophylaxis-   Heparin - SCDs   AM Labs Ordered, also please review Full  Orders  Family Communication: No family at bedside  Code Status:  Full  Admission status: Observation Disposition: Anticipated Discharge date 24 hours Discharge to Home  Time spent in minutes : [place]

## 2021-01-15 DIAGNOSIS — R197 Diarrhea, unspecified: Secondary | ICD-10-CM

## 2021-01-15 DIAGNOSIS — R112 Nausea with vomiting, unspecified: Secondary | ICD-10-CM

## 2021-01-15 LAB — CBC
HCT: 45.6 % (ref 36.0–46.0)
Hemoglobin: 14.7 g/dL (ref 12.0–15.0)
MCH: 31.8 pg (ref 26.0–34.0)
MCHC: 32.2 g/dL (ref 30.0–36.0)
MCV: 98.7 fL (ref 80.0–100.0)
Platelets: 195 10*3/uL (ref 150–400)
RBC: 4.62 MIL/uL (ref 3.87–5.11)
RDW: 12.4 % (ref 11.5–15.5)
WBC: 7.4 10*3/uL (ref 4.0–10.5)
nRBC: 0 % (ref 0.0–0.2)

## 2021-01-15 LAB — BASIC METABOLIC PANEL
Anion gap: 6 (ref 5–15)
BUN: 5 mg/dL — ABNORMAL LOW (ref 6–20)
CO2: 25 mmol/L (ref 22–32)
Calcium: 8.2 mg/dL — ABNORMAL LOW (ref 8.9–10.3)
Chloride: 106 mmol/L (ref 98–111)
Creatinine, Ser: 0.77 mg/dL (ref 0.44–1.00)
GFR, Estimated: >60 mL/min (ref >60)
Glucose, Bld: 105 mg/dL — ABNORMAL HIGH (ref 70–99)
Potassium: 4.1 mmol/L (ref 3.5–5.1)
Sodium: 137 mmol/L (ref 135–145)

## 2021-01-15 NOTE — Progress Notes (Deleted)
Pt was found in room by another nurse because he asked for help. He's coughing with his O2 sat 87. He refused his nebulizer treatment from respiratory and stated, "I'm not taking it because I want to live." He refuses to keep his O2 on and will not allow anybody to help him. He is also asking for security and threatening to walk out of the hospital. Tried to redirect pt but was unsuccessful.

## 2021-01-15 NOTE — Progress Notes (Signed)
Subjective: Feeling a lot better. Every time she eats, it causes abdominal pain. Sharp. Mid to low abdomen and left lower. Chronic issues with LLQ pain since having her daughter. 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   Sunday she was having multiple Bms per day. 1 BM yesterday and 1 BM today. More clumpy. She has had dark stool. Took pepto bismoil on Sunday.   Overall nausea and vomiting improved. Very mild nausea triggered by pain. No Vomiting since Sunday. Chronic nausea and vomiting. Burning periumbilical pain and nausea daily. Will wake with pain. Usually improved by a BM. Present 4-5 years. Daily. Triggered by eating and drinking alcohol. Chronically with loose stools. When she stopped drinking a couple months ago, her stomach problems improved. Drinks 3-4 bootleggers 2-3 nights a week.   Admits to chronic reflux. Intermittent solid food dysphagia.   Unfortunately, the patient tried GI soft diet and endorsed having significant abdominal pain and nausea to the nursing staff.  This was notified to our team in the late afternoon.  Objective: Vital signs in last 24 hours: Temp:  [97.8 F (36.6 C)-98.8 F (37.1 C)] 98.3 F (36.8 C) (12/13 0534) Pulse Rate:  [53-81] 68 (12/13 0534) Resp:  [16-18] 18 (12/13 0534) BP: (124-141)/(59-88) 141/88 (12/13 0534) SpO2:  [98 %-100 %] 100 % (12/13 0534) Last BM Date: 01/13/21 General:   Alert and oriented, pleasant Head:  Normocephalic and atraumatic. Eyes:  No icterus, sclera clear. Conjuctiva pink.  Mouth:  Without lesions, mucosa pink and moist.  Neck:  Supple, without thyromegaly or masses.  Heart:  S1, S2 present, no murmurs noted.  Lungs: Clear to auscultation bilaterally, without wheezing, rales, or rhonchi.  Abdomen:  Bowel sounds present, soft, non-tender, non-distended. No HSM or hernias noted. No rebound or guarding. No masses appreciated  Msk:  Symmetrical without gross deformities. Normal  posture. Pulses:  Normal pulses noted. Extremities:  Without clubbing or edema. Neurologic:  Alert and  oriented x4;  grossly normal neurologically. Skin:  Warm and dry, intact without significant lesions.  Cervical Nodes:  No significant cervical adenopathy. Psych:  Alert and cooperative. Normal mood and affect.  Intake/Output from previous day: 12/12 0701 - 12/13 0700 In: 2056.3 [P.O.:1080; I.V.:626.4; IV Piggyback:350] Out: -  Intake/Output this shift: Total I/O In: 720 [P.O.:720] Out: -   Lab Results: Recent Labs    01/13/21 1910 01/14/21 0445 01/15/21 0513  WBC 12.3* 12.8* 7.4  HGB 16.3* 14.6 14.7  HCT 50.2* 44.9 45.6  PLT 279 229 195   BMET Recent Labs    01/13/21 1910 01/14/21 0445 01/15/21 0513  NA 137 137 137  K 4.0 3.3* 4.1  CL 105 108 106  CO2 _0 GLUCOSE 121* 99 105*  BUN 9 6 5*  CREATININE 0.70 0.62 0.77  CALCIUM 8.8* 7.9* 8.2*   LFT Recent Labs    01/13/21 1910 01/14/21 0445  PROT 6.9 5.8*  ALBUMIN 4.2 3.4*  AST 15 11*  ALT 15 12  ALKPHOS 56 44  BILITOT 1.5* 1.5*   PT/INR No results for input(s): LABPROT, INR in the last 72 hours. Hepatitis Panel No results for input(s): HEPBSAG, HCVAB, HEPAIGM, HEPBIGM in the last 72 hours. C-Diff PCR negative  Studies/Results: CT Abdomen Pelvis W Contrast  Result Date: 01/13/2021 CLINICAL DATA:  Abdominal pain, nausea/vomiting/diarrhea, bradycardia EXAM: CT ABDOMEN AND PELVIS WITH CONTRAST TECHNIQUE: Multidetector CT imaging of the abdomen and pelvis was performed using the standard  protocol following bolus administration of intravenous contrast. CONTRAST:  147m OMNIPAQUE IOHEXOL 300 MG/ML  SOLN COMPARISON:  09/26/2018 FINDINGS: Lower chest: Lung bases are clear. Hepatobiliary: Liver is within normal limits. Gallbladder is unremarkable. No intrahepatic or extrahepatic ductal dilatation. Pancreas: Within normal limits. Spleen: Within normal limits. Adrenals/Urinary Tract: Adrenal glands are  within normal limits. Kidneys are within normal limits.  No hydronephrosis. Bladder is within normal limits. Stomach/Bowel: Stomach is notable for a tiny hiatal hernia. No evidence of bowel obstruction. Normal appendix (series 2/image 66). Wall thickening involving the ascending and transverse colon (series 2/image 42), suggesting infectious/inflammatory colitis. Cecum and rectosigmoid colon are preserved. Vascular/Lymphatic: No evidence of abdominal aortic aneurysm. No suspicious abdominopelvic lymphadenopathy. Reproductive: Uterus is notable for an IUD in satisfactory position. Bilateral ovaries are within normal limits. Other: No abdominopelvic ascites. Musculoskeletal: Visualized osseous structures are within normal limits. IMPRESSION: Wall thickening involving the ascending and transverse colon, suggesting infectious/inflammatory colitis. Electronically Signed   By: SJulian HyM.D.   On: 01/13/2021 22:30    Assessment: Briefly, this is a 35year old female with pelvic and history of esophagogastroduodenospy, marijuana use and recurrent episodes abdominal pain, who came to the hospital after presenting new onset of nausea, vomiting and diarrhea.  Underwent a CT of the abdomen and pelvis that showed presence of inflammation of her ascending and transverse colon without presence of leukocytosis or changes in her CRP and ESR.  She had negative C. difficile testing but her GI pathogen panel could not be collected as she did not move her bowels anymore.  She tolerated advancing her diet to clear liquids initially but had worsening symptoms of pain when the diet was advanced to GI soft.  Due to this, we will recommend going back to clear liquid diet with possibility of colonoscopy on Thursday to evaluate her colon further due to concern of IBD leading to her symptoms.  Also, stool testing to be collected to determine if there is any GI pathogen.  For now she should continue ciprofloxacin and  metronidazole.  - Restart clear liquid diet -Continue ciprofloxacin and metronidazole - Possible colonoscopy on Thursday - Bentyl as needed for abdominal pain - Collect GI pathogen panel    LOS: 1 day    01/15/2021, 10:50 AM

## 2021-01-15 NOTE — Progress Notes (Signed)
PROGRESS NOTE    CAGNEY FALSO  [id number] DOB: 1985-06-25 DOA: 01/13/2021 PCP: Patient, No Pcp Per (Inactive)   Brief Narrative: KAYREN KISSLING is a 35 y.o. female with a history of tobacco and marijuana use. Patient presented secondary to intractable nausea and vomiting, and diarrhea with evidence for colitis on CT abdomen/pelvis. Unsure if infectious or inflammatory. GI consulted. C. Difficile negative. Started empirically on Ciprofloxacin and Flagyl. GI pathogen panel pending. Colonoscopy planned since patient has continued symptoms.   Assessment & Plan:   Principal Problem:   Colitis Active Problems:   Nausea vomiting and diarrhea   Colitis Possibly infectious vs inflammatory. GI pathogen panel pending. Patient started empirically on Ciprofloxacin and Flagyl; mild leukocytosis. History of recurrent bowel illness. C. Difficile negative. -GI recommendations: plan for bowel prep and colonoscopy -Zofran prn -GI pathogen panel pending -Clear liquid diet -Continue Ciprofloxacin/Flagyl, although may be able to discontinue this prior to discharge   Nausea/vomiting Likely secondary to above   Marijuana use Counseled on admission   Tobacco use Counseled on admission. Nicotine patch declined.   Hypokalemia Resolved with supplementation.   Ketonuria Likely starvation ketosis secondary to nausea/vomiting symptoms.   DVT prophylaxis: Heparin subq Code Status:   Code Status: Full Code Family Communication: Mother at bedside Disposition Plan: Discharge home pending continued GI management/recommendations with plan for Colonoscopy in two days   Consultants:  Gastroenterology  Procedures:  None  Antimicrobials: Ciprofloxacin Flagyl    Subjective: No more nausea/vomiting but continues to have diarrhea and abdominal pain with oral intake.  Objective: Vitals:   01/14/21 2055 01/14/21 2336 01/15/21 0534 01/15/21 1315  BP: 124/77 129/76 (!) 141/88 133/78   Pulse: 78 (!) 53 68 (!) 55  Resp: 18 16 18 20   Temp: 98.8 F (37.1 C) 98.2 F (36.8 C) 98.3 F (36.8 C) 98.2 F (36.8 C)  TempSrc: Oral Oral Oral Oral  SpO2: 98% 100% 100% 98%  Weight:      Height:        Intake/Output Summary (Last 24 hours) at 01/15/2021 1323 Last data filed at 01/15/2021 0900 Gross per 24 hour  Intake 2536.32 ml  Output --  Net 2536.32 ml   Filed Weights   01/13/21 1841 01/13/21 2320  Weight: 120.2 kg 118.7 kg    Examination:  General exam: Appears calm and comfortable Respiratory system: Clear to auscultation. Respiratory effort normal. Cardiovascular system: S1 & S2 heard, RRR. No murmurs, rubs, gallops or clicks. Gastrointestinal system: Abdomen is nondistended, soft. No organomegaly or masses felt. Normal bowel sounds heard. Central nervous system: Alert and oriented. No focal neurological deficits. Musculoskeletal: No edema. No calf tenderness Skin: No cyanosis. No rashes Psychiatry: Judgement and insight appear normal. Mood & affect appropriate.     Data Reviewed: I have personally reviewed following labs and imaging studies  CBC Lab Results  Component Value Date   WBC 7.4 01/15/2021   RBC 4.62 01/15/2021   HGB 14.7 01/15/2021   HCT 45.6 01/15/2021   MCV 98.7 01/15/2021   MCH 31.8 01/15/2021   PLT 195 01/15/2021   MCHC 32.2 01/15/2021   RDW 12.4 01/15/2021   LYMPHSABS 2.2 01/14/2021   MONOABS 1.0 01/14/2021   EOSABS 0.1 01/14/2021   BASOSABS 0.0 99991111     Last metabolic panel Lab Results  Component Value Date   NA 137 01/15/2021   K 4.1 01/15/2021   CL 106 01/15/2021   CO2 25 01/15/2021   BUN 5 (L) 01/15/2021   CREATININE  0.77 01/15/2021   GLUCOSE 105 (H) 01/15/2021   GFRNONAA >60 01/15/2021   GFRAA >60 09/25/2018   CALCIUM 8.2 (L) 01/15/2021   PROT 5.8 (L) 01/14/2021   ALBUMIN 3.4 (L) 01/14/2021   BILITOT 1.5 (H) 01/14/2021   ALKPHOS 44 01/14/2021   AST 11 (L) 01/14/2021   ALT 12 01/14/2021   ANIONGAP 6  01/15/2021    CBG (last 3)  No results for input(s): GLUCAP in the last 72 hours.   GFR: Estimated Creatinine Clearance: 122.1 mL/min (by C-G formula based on SCr of 0.77 mg/dL).  Coagulation Profile: No results for input(s): INR, PROTIME in the last 168 hours.  Recent Results (from the past 240 hour(s))  Resp Panel by RT-PCR (Flu A&B, Covid) Nasopharyngeal Swab     Status: None   Collection Time: 01/13/21  7:26 PM   Specimen: Nasopharyngeal Swab; Nasopharyngeal(NP) swabs in vial transport medium  Result Value Ref Range Status   SARS Coronavirus 2 by RT PCR NEGATIVE NEGATIVE Final    Comment: (NOTE) SARS-CoV-2 target nucleic acids are NOT DETECTED.  The SARS-CoV-2 RNA is generally detectable in upper respiratory specimens during the acute phase of infection. The lowest concentration of SARS-CoV-2 viral copies this assay can detect is 138 copies/mL. A negative result does not preclude SARS-Cov-2 infection and should not be used as the sole basis for treatment or other patient management decisions. A negative result may occur with  improper specimen collection/handling, submission of specimen other than nasopharyngeal swab, presence of viral mutation(s) within the areas targeted by this assay, and inadequate number of viral copies(<138 copies/mL). A negative result must be combined with clinical observations, patient history, and epidemiological information. The expected result is Negative.  Fact Sheet for Patients:  BloggerCourse.com  Fact Sheet for Healthcare Providers:  SeriousBroker.it  This test is no t yet approved or cleared by the [place] FDA and  has been authorized for detection and/or diagnosis of SARS-CoV-2 by FDA under an Emergency Use Authorization (EUA). This EUA will remain  in effect (meaning this test can be used) for the duration of the COVID-19 declaration under Section 564(b)(1) of the Act,  21 U.S.C.section 360bbb-3(b)(1), unless the authorization is terminated  or revoked sooner.       Influenza A by PCR NEGATIVE NEGATIVE Final   Influenza B by PCR NEGATIVE NEGATIVE Final    Comment: (NOTE) The Xpert Xpress SARS-CoV-2/FLU/RSV plus assay is intended as an aid in the diagnosis of influenza from Nasopharyngeal swab specimens and should not be used as a sole basis for treatment. Nasal washings and aspirates are unacceptable for Xpert Xpress SARS-CoV-2/FLU/RSV testing.  Fact Sheet for Patients: BloggerCourse.com  Fact Sheet for Healthcare Providers: SeriousBroker.it  This test is not yet approved or cleared by the [place] FDA and has been authorized for detection and/or diagnosis of SARS-CoV-2 by FDA under an Emergency Use Authorization (EUA). This EUA will remain in effect (meaning this test can be used) for the duration of the COVID-19 declaration under Section 564(b)(1) of the Act, 21 U.S.C. section 360bbb-3(b)(1), unless the authorization is terminated or revoked.  Performed at [facility], [address], [place], [place] [address]   C Difficile Quick Screen w PCR reflex     Status: None   Collection Time: 01/14/21  5:20 PM   Specimen: STOOL  Result Value Ref Range Status   C Diff antigen NEGATIVE NEGATIVE Final   C Diff toxin NEGATIVE NEGATIVE Final   C Diff interpretation No C. difficile  detected.  Final    Comment: Performed at [facility], [address], [place], [place] [address]        Radiology Studies: CT Abdomen Pelvis W Contrast  Result Date: 01/13/2021 CLINICAL DATA:  Abdominal pain, nausea/vomiting/diarrhea, bradycardia EXAM: CT ABDOMEN AND PELVIS WITH CONTRAST TECHNIQUE: Multidetector CT imaging of the abdomen and pelvis was performed using the standard protocol following bolus administration of intravenous contrast. CONTRAST:  161mL OMNIPAQUE IOHEXOL 300 MG/ML  SOLN COMPARISON:   09/26/2018 FINDINGS: Lower chest: Lung bases are clear. Hepatobiliary: Liver is within normal limits. Gallbladder is unremarkable. No intrahepatic or extrahepatic ductal dilatation. Pancreas: Within normal limits. Spleen: Within normal limits. Adrenals/Urinary Tract: Adrenal glands are within normal limits. Kidneys are within normal limits.  No hydronephrosis. Bladder is within normal limits. Stomach/Bowel: Stomach is notable for a tiny hiatal hernia. No evidence of bowel obstruction. Normal appendix (series 2/image 66). Wall thickening involving the ascending and transverse colon (series 2/image 42), suggesting infectious/inflammatory colitis. Cecum and rectosigmoid colon are preserved. Vascular/Lymphatic: No evidence of abdominal aortic aneurysm. No suspicious abdominopelvic lymphadenopathy. Reproductive: Uterus is notable for an IUD in satisfactory position. Bilateral ovaries are within normal limits. Other: No abdominopelvic ascites. Musculoskeletal: Visualized osseous structures are within normal limits. IMPRESSION: Wall thickening involving the ascending and transverse colon, suggesting infectious/inflammatory colitis. Electronically Signed   By: Julian Hy M.D.   On: 01/13/2021 22:30        Scheduled Meds:  heparin  5,000 Units Subcutaneous Q8H    morphine injection  4 mg Intravenous Once   pantoprazole (PROTONIX) IV  40 mg Intravenous Q24H   Continuous Infusions:  sodium chloride 100 mL/hr at 01/15/21 0630   ciprofloxacin 400 mg (01/15/21 0016)   metronidazole 500 mg (01/15/21 1208)   promethazine (PHENERGAN) injection (IM or IVPB)       LOS: 1 day     Cordelia Poche, MD [facility] 01/15/2021, 1:23 PM  If 7PM-7AM, please contact night-coverage [email or web address]

## 2021-01-16 LAB — GASTROINTESTINAL PANEL BY PCR, STOOL (REPLACES STOOL CULTURE)

## 2021-01-16 MED ORDER — PEG 3350-KCL-NA BICARB-NACL 420 G PO SOLR
4000.0000 mL | Freq: Once | ORAL | Status: AC
  Administered 2021-01-16: 14:00:00 4000 mL via ORAL

## 2021-01-16 NOTE — Progress Notes (Signed)
Subjective: Patient states that abdominal pain has been mild today, none right now, she did need some pain medication around 8 this morning, and has not eaten or drank anything yet. She did have some nausea this morning without vomiting, may have been related to the morphine she got. She reports two loose stools this morning without rectal bleeding or melena. She states that yesterday she had a darker stool but on her second BM, thereafter, was more green in color.  She has not had any issues with acid reflux during admission but does have acid reflux symptoms on outpatient basis. She does endorse intermittent dysphagia as well that has been ongoing for about 2 years, symptoms are worse when she has acid reflux. She usually drinks some vinegar when she has acid reflux at home which helps. She avoid spicy foods normally but reports alcohol usually triggers her reflux, she drinks 2-3x/week, usually wine coolers maybe 4 per time (12 per week.   Objective: Vital signs in last 24 hours: Temp:  [98 F (36.7 C)-98.2 F (36.8 C)] 98 F (36.7 C) (12/14 0619) Pulse Rate:  [55-77] 61 (12/14 0619) Resp:  [18-20] 18 (12/14 0619) BP: (125-138)/(78-92) 125/89 (12/14 0619) SpO2:  [97 %-99 %] 99 % (12/14 0619) Last BM Date: 01/15/21 General:   Alert and oriented, pleasant Head:  Normocephalic and atraumatic. Eyes:  No icterus, sclera clear. Conjuctiva pink.  Mouth:  Without lesions, mucosa pink and moist.  Heart:  S1, S2 present, no murmurs noted.  Lungs: Clear to auscultation bilaterally, without wheezing, rales, or rhonchi.  Abdomen:  Bowel sounds present, soft,  non-distended. No HSM or hernias noted. No rebound or guarding. No masses appreciated. TTP of RLQ Msk:  Symmetrical without gross deformities. Normal posture. Pulses:  Normal pulses noted. Extremities:  Without clubbing or edema. Neurologic:  Alert and  oriented x4;  grossly normal neurologically. Skin:  Warm and dry, intact without significant  lesions.  Psych:  Alert and cooperative. Normal mood and affect.  Intake/Output from previous day: 12/13 0701 - 12/14 0700 In: 3324.9 [P.O.:1520; I.V.:1504.9; IV Piggyback:300] Out: -  Intake/Output this shift: No intake/output data recorded.  Lab Results: Recent Labs    01/13/21 1910 01/14/21 0445 01/15/21 0513  WBC 12.3* 12.8* 7.4  HGB 16.3* 14.6 14.7  HCT 50.2* 44.9 45.6  PLT 279 229 195   BMET Recent Labs    01/13/21 1910 01/14/21 0445 01/15/21 0513  NA 137 137 137  K 4.0 3.3* 4.1  CL 105 108 106  CO2 $Re'24 24 25  'nHP$ GLUCOSE 121* 99 105*  BUN 9 6 5*  CREATININE 0.70 0.62 0.77  CALCIUM 8.8* 7.9* 8.2*   LFT Recent Labs    01/13/21 1910 01/14/21 0445  PROT 6.9 5.8*  ALBUMIN 4.2 3.4*  AST 15 11*  ALT 15 12  ALKPHOS 56 53  BILITOT 1.5* 1.5*   Assessment: 35 year old female with previous history of EGD, marijuana use and recurrent episodes of abdominal pain, who presented to the hospital on 12/11 for new onset of n/v/d. CT A/P showed presence of inflammation of ascending and transverse colon without presence of leukocytosis or changes in CRP or ESR. GI consulted for Colitis.  Patient has had minimal diarrhea since admission, two episodes of loose stools today, Leukocytosis has resolved with WBC 7.4 yesterday. GI path panel is pending, C diff negative. She has been continued on empiric cipro and flagyl. Notably, she has had evidence of prior inflammation on CT imaging in Aug  2022 and notes chronic history of diarrhea, she denies rectal bleeding. States that this has been ongoing for the past 5 years. Will proceed with diagnostic colonoscopy tomorrow for further evaluation of inflammation of ascending and transverse colon and ongoing diarrhea.   Plan: Continue to follow for GI path panel results Hold anti diarrheals until GI path panel results Continue empiric abx Clear liquid diet, NPO at midnight Colonoscopy tomorrow    LOS: 2 days    01/16/2021, 9:14  AM   Havanah Nelms L. Alver Sorrow, MSN, APRN, AGNP-C Adult-Gerontology Nurse Practitioner [facility]

## 2021-01-16 NOTE — Progress Notes (Signed)
Has completed bowel prep and consent for colonoscopy signed and in chart.  Walking in hallway now.

## 2021-01-16 NOTE — Progress Notes (Signed)
PROGRESS NOTE    Kristen Bates  CBS:[id number] DOB: 1985-04-05 DOA: 01/13/2021 PCP: Patient, No Pcp Per (Inactive)   Brief Narrative:   Kristen Bates is a 35 y.o. female with a history of tobacco and marijuana use. Patient presented secondary to intractable nausea and vomiting, and diarrhea with evidence for colitis on CT abdomen/pelvis. Unsure if infectious or inflammatory. GI consulted. C. Difficile negative. Started empirically on Ciprofloxacin and Flagyl. GI pathogen panel pending. Colonoscopy planned for am.  Assessment & Plan:   Principal Problem:   Colitis Active Problems:   Nausea vomiting and diarrhea   Colitis Possibly infectious vs inflammatory. GI pathogen panel pending. Patient started empirically on Ciprofloxacin and Flagyl; mild leukocytosis. History of recurrent bowel illness. C. Difficile negative. -GI recommendations: plan for bowel prep and colonoscopy on 12/15 -Zofran prn -GI pathogen panel pending -Clear liquid diet with npo after midnight -Continue Ciprofloxacin/Flagyl, although may be able to discontinue this prior to discharge   Nausea/vomiting-improving Likely secondary to above DC IVF   Marijuana use Counseled on admission   Tobacco use Counseled on admission. Nicotine patch declined.   Hypokalemia Resolved with supplementation. Monitor am labs   Ketonuria Likely starvation ketosis secondary to nausea/vomiting symptoms.     DVT prophylaxis: Heparin subq Code Status:   Code Status: Full Code Family Communication: None at bedside Disposition Plan: Discharge home pending continued GI management/recommendations with plan for Colonoscopy in two days     Consultants:  Gastroenterology   Procedures:  None   Antimicrobials: Ciprofloxacin Flagyl    Subjective: Patient seen and evaluated today with improving n/v. 2 BM noted this am.  Objective: Vitals:   01/15/21 1315 01/15/21 2100 01/16/21 0619 01/16/21 1347  BP: 133/78 (!) 138/92  125/89 127/79  Pulse: (!) 55 77 61 63  Resp: 20 19 18 15   Temp: 98.2 F (36.8 C) 98.2 F (36.8 C) 98 F (36.7 C) 98 F (36.7 C)  TempSrc: Oral Oral  Oral  SpO2: 98% 97% 99% 100%  Weight:      Height:        Intake/Output Summary (Last 24 hours) at 01/16/2021 1455 Last data filed at 01/16/2021 1300 Gross per 24 hour  Intake 2844.91 ml  Output --  Net 2844.91 ml   Filed Weights   01/13/21 1841 01/13/21 2320  Weight: 120.2 kg 118.7 kg    Examination:  General exam: Appears calm and comfortable, obese Respiratory system: Clear to auscultation. Respiratory effort normal. Cardiovascular system: S1 & S2 heard, RRR.  Gastrointestinal system: Abdomen is soft Central nervous system: Alert and awake Extremities: No edema Skin: No significant lesions noted Psychiatry: Flat affect.    Data Reviewed: I have personally reviewed following labs and imaging studies  CBC: Recent Labs  Lab 01/13/21 1910 01/14/21 0445 01/15/21 0513  WBC 12.3* 12.8* 7.4  NEUTROABS  --  9.4*  --   HGB 16.3* 14.6 14.7  HCT 50.2* 44.9 45.6  MCV 98.4 98.7 98.7  PLT 279 229 195   Basic Metabolic Panel: Recent Labs  Lab 01/13/21 1910 01/14/21 0445 01/15/21 0513  NA 137 137 137  K 4.0 3.3* 4.1  CL 105 108 106  CO2 24 24 25   GLUCOSE 121* 99 105*  BUN 9 6 5*  CREATININE 0.70 0.62 0.77  CALCIUM 8.8* 7.9* 8.2*  MG  --  1.5*  --    GFR: Estimated Creatinine Clearance: 122.1 mL/min (by C-G formula based on SCr of 0.77 mg/dL). Liver Function Tests: Recent Labs  Lab 01/13/21 1910 01/14/21 0445  AST 15 11*  ALT 15 12  ALKPHOS 56 44  BILITOT 1.5* 1.5*  PROT 6.9 5.8*  ALBUMIN 4.2 3.4*   Recent Labs  Lab 01/13/21 1910  LIPASE 39   No results for input(s): AMMONIA in the last 168 hours. Coagulation Profile: No results for input(s): INR, PROTIME in the last 168 hours. Cardiac Enzymes: No results for input(s): CKTOTAL, CKMB, CKMBINDEX, TROPONINI in the last 168 hours. BNP (last 3  results) No results for input(s): PROBNP in the last 8760 hours. HbA1C: No results for input(s): HGBA1C in the last 72 hours. CBG: No results for input(s): GLUCAP in the last 168 hours. Lipid Profile: No results for input(s): CHOL, HDL, LDLCALC, TRIG, CHOLHDL, LDLDIRECT in the last 72 hours. Thyroid Function Tests: No results for input(s): TSH, T4TOTAL, FREET4, T3FREE, THYROIDAB in the last 72 hours. Anemia Panel: No results for input(s): VITAMINB12, FOLATE, FERRITIN, TIBC, IRON, RETICCTPCT in the last 72 hours. Sepsis Labs: No results for input(s): PROCALCITON, LATICACIDVEN in the last 168 hours.  Recent Results (from the past 240 hour(s))  Resp Panel by RT-PCR (Flu A&B, Covid) Nasopharyngeal Swab     Status: None   Collection Time: 01/13/21  7:26 PM   Specimen: Nasopharyngeal Swab; Nasopharyngeal(NP) swabs in vial transport medium  Result Value Ref Range Status   SARS Coronavirus 2 by RT PCR NEGATIVE NEGATIVE Final    Comment: (NOTE) SARS-CoV-2 target nucleic acids are NOT DETECTED.  The SARS-CoV-2 RNA is generally detectable in upper respiratory specimens during the acute phase of infection. The lowest concentration of SARS-CoV-2 viral copies this assay can detect is 138 copies/mL. A negative result does not preclude SARS-Cov-2 infection and should not be used as the sole basis for treatment or other patient management decisions. A negative result may occur with  improper specimen collection/handling, submission of specimen other than nasopharyngeal swab, presence of viral mutation(s) within the areas targeted by this assay, and inadequate number of viral copies(<138 copies/mL). A negative result must be combined with clinical observations, patient history, and epidemiological information. The expected result is Negative.  Fact Sheet for Patients:  EntrepreneurPulse.com.au  Fact Sheet for Healthcare Providers:   IncredibleEmployment.be  This test is no t yet approved or cleared by the [place] FDA and  has been authorized for detection and/or diagnosis of SARS-CoV-2 by FDA under an Emergency Use Authorization (EUA). This EUA will remain  in effect (meaning this test can be used) for the duration of the COVID-19 declaration under Section 564(b)(1) of the Act, 21 U.S.C.section 360bbb-3(b)(1), unless the authorization is terminated  or revoked sooner.       Influenza A by PCR NEGATIVE NEGATIVE Final   Influenza B by PCR NEGATIVE NEGATIVE Final    Comment: (NOTE) The Xpert Xpress SARS-CoV-2/FLU/RSV plus assay is intended as an aid in the diagnosis of influenza from Nasopharyngeal swab specimens and should not be used as a sole basis for treatment. Nasal washings and aspirates are unacceptable for Xpert Xpress SARS-CoV-2/FLU/RSV testing.  Fact Sheet for Patients: EntrepreneurPulse.com.au  Fact Sheet for Healthcare Providers: IncredibleEmployment.be  This test is not yet approved or cleared by the [place] FDA and has been authorized for detection and/or diagnosis of SARS-CoV-2 by FDA under an Emergency Use Authorization (EUA). This EUA will remain in effect (meaning this test can be used) for the duration of the COVID-19 declaration under Section 564(b)(1) of the Act, 21 U.S.C. section 360bbb-3(b)(1), unless the authorization is terminated or revoked.  Performed at [facility], [address] [place], [place] [address]   C Difficile Quick Screen w PCR reflex     Status: None   Collection Time: 01/14/21  5:20 PM   Specimen: Stool  Result Value Ref Range Status   C Diff antigen NEGATIVE NEGATIVE Final   C Diff toxin NEGATIVE NEGATIVE Final   C Diff interpretation No C. difficile detected.  Final    Comment: Performed at [facility], [address] [place], [place] [address]         Radiology Studies: No results  found.      Scheduled Meds:  heparin  5,000 Units Subcutaneous Q8H    morphine injection  4 mg Intravenous Once   pantoprazole (PROTONIX) IV  40 mg Intravenous Q24H   Continuous Infusions:  ciprofloxacin 400 mg (01/16/21 1255)   metronidazole 500 mg (01/16/21 1355)   promethazine (PHENERGAN) injection (IM or IVPB)       LOS: 2 days    Time spent: 35 minutes    Jebadiah Imperato Darleen Crocker, DO [facility]  If 7PM-7AM, please contact night-coverage [email or web address] 01/16/2021, 2:55 PM

## 2021-01-17 ENCOUNTER — Inpatient Hospital Stay (HOSPITAL_COMMUNITY): Payer: Medicaid Other | Admitting: Anesthesiology

## 2021-01-17 ENCOUNTER — Encounter (HOSPITAL_COMMUNITY)
Admission: EM | Disposition: A | Discharge status: Home / Self Care | Payer: Self-pay | Source: Home / Self Care | Attending: Family Medicine

## 2021-01-17 ENCOUNTER — Telehealth: Payer: Self-pay | Admitting: Gastroenterology

## 2021-01-17 ENCOUNTER — Encounter (HOSPITAL_COMMUNITY): Payer: Self-pay | Admitting: Family Medicine

## 2021-01-17 HISTORY — PX: COLONOSCOPY WITH PROPOFOL: SHX5780

## 2021-01-17 HISTORY — PX: BIOPSY: SHX5522

## 2021-01-17 LAB — BASIC METABOLIC PANEL
Anion gap: 8 (ref 5–15)
BUN: 5 mg/dL — ABNORMAL LOW (ref 6–20)
CO2: 25 mmol/L (ref 22–32)
Calcium: 8.4 mg/dL — ABNORMAL LOW (ref 8.9–10.3)
Chloride: 105 mmol/L (ref 98–111)
Creatinine, Ser: 0.85 mg/dL (ref 0.44–1.00)
GFR, Estimated: >60 mL/min (ref >60)
Glucose, Bld: 103 mg/dL — ABNORMAL HIGH (ref 70–99)
Potassium: 3.7 mmol/L (ref 3.5–5.1)
Sodium: 138 mmol/L (ref 135–145)

## 2021-01-17 LAB — MAGNESIUM: Magnesium: 1.6 mg/dL — ABNORMAL LOW (ref 1.7–2.4)

## 2021-01-17 SURGERY — COLONOSCOPY WITH PROPOFOL
Anesthesia: General

## 2021-01-17 MED ORDER — CIPROFLOXACIN HCL 500 MG PO TABS: 500.0000 mg | ORAL_TABLET | Freq: Two times a day (BID) | ORAL | 0 refills | Status: AC

## 2021-01-17 MED ORDER — STERILE WATER FOR IRRIGATION IR SOLN
Status: DC | PRN
  Administered 2021-01-17: 120 mL

## 2021-01-17 MED ORDER — PROPOFOL 10 MG/ML IV BOLUS
INTRAVENOUS | Status: AC
  Filled 2021-01-17: qty 20

## 2021-01-17 MED ORDER — LIDOCAINE HCL (PF) 2 % IJ SOLN
INTRAMUSCULAR | Status: AC
  Filled 2021-01-17: qty 5

## 2021-01-17 MED ORDER — METOCLOPRAMIDE HCL 5 MG/ML IJ SOLN
10.0000 mg | Freq: Once | INTRAMUSCULAR | Status: AC
  Administered 2021-01-17: 10 mg via INTRAVENOUS

## 2021-01-17 MED ORDER — ONDANSETRON 4 MG PO TBDP: 4.0000 mg | ORAL_TABLET | Freq: Three times a day (TID) | ORAL | 1 refills | Status: AC | PRN

## 2021-01-17 MED ORDER — PROPOFOL 10 MG/ML IV BOLUS
INTRAVENOUS | Status: DC | PRN
  Administered 2021-01-17: 100 mg via INTRAVENOUS

## 2021-01-17 MED ORDER — PROPOFOL 500 MG/50ML IV EMUL
INTRAVENOUS | Status: DC | PRN
Start: 2021-01-17 — End: 2021-01-17
  Administered 2021-01-17: 150 ug/kg/min via INTRAVENOUS

## 2021-01-17 MED ORDER — METRONIDAZOLE 500 MG PO TABS: 500.0000 mg | ORAL_TABLET | Freq: Three times a day (TID) | ORAL | 0 refills | Status: AC

## 2021-01-17 MED ORDER — LACTATED RINGERS IV SOLN: INTRAVENOUS | Status: DC

## 2021-01-17 MED ORDER — ONDANSETRON HCL 4 MG/2ML IJ SOLN
4.0000 mg | Freq: Once | INTRAMUSCULAR | Status: AC
  Administered 2021-01-17: 4 mg via INTRAVENOUS

## 2021-01-17 MED ORDER — LIDOCAINE HCL 1 % IJ SOLN
INTRAMUSCULAR | Status: DC | PRN
  Administered 2021-01-17: 50 mg via INTRADERMAL

## 2021-01-17 MED ORDER — METOCLOPRAMIDE HCL 5 MG/ML IJ SOLN
INTRAMUSCULAR | Status: AC
  Filled 2021-01-17: qty 2

## 2021-01-17 NOTE — Interval H&P Note (Signed)
History and Physical Interval Note:  01/17/2021 8:22 AM  Kristen Bates  has presented today for surgery, with the diagnosis of ascending and transverse colon wall thickening, diarrhea.  The various methods of treatment have been discussed with the patient and family. After consideration of risks, benefits and other options for treatment, the patient has consented to  Procedure(s): COLONOSCOPY WITH PROPOFOL (N/A) as a surgical intervention.  The patient's history has been reviewed, patient examined, no change in status, stable for surgery.  I have reviewed the patient's chart and labs.  Questions were answered to the patient's satisfaction.     Lanelle Bal

## 2021-01-17 NOTE — Anesthesia Postprocedure Evaluation (Signed)
Anesthesia Post Note  Patient: AMENAH TUCCI  Procedure(s) Performed: COLONOSCOPY WITH PROPOFOL BIOPSY  Patient location during evaluation: PACU Anesthesia Type: General Level of consciousness: awake and alert Pain management: pain level controlled Vital Signs Assessment: post-procedure vital signs reviewed and stable Respiratory status: spontaneous breathing Cardiovascular status: blood pressure returned to baseline and stable Postop Assessment: no apparent nausea or vomiting Anesthetic complications: no   No notable events documented.   Last Vitals:  Vitals:   01/17/21 0358 01/17/21 0800  BP: 121/80 (!) (P) 143/87  Pulse: 63 (P) 65  Resp: 20 (P) 20  Temp: 36.9 C (P) 36.9 C  SpO2: 99% (P) 98%    Last Pain:  Vitals:   01/17/21 0828  TempSrc:   PainSc: 0-No pain                 Bryahna Lesko

## 2021-01-17 NOTE — Discharge Summary (Signed)
Physician Discharge Summary  Kristen Bates [id number] DOB: 1985/07/17 DOA: 01/13/2021  PCP: Patient, No Pcp Per (Inactive)  Admit date: 01/13/2021  Discharge date: 01/17/2021  Admitted From:Home  Disposition:  Home  Recommendations for Outpatient Follow-up:  Follow up with PCP in 1-2 weeks Follow-up with GI which will be scheduled in the next couple weeks to follow-up results of biopsy and consider EGD if needed Continue on ciprofloxacin and Flagyl as prescribed for 7 more days to complete a total 10-day course of treatment  Home Health: None  Equipment/Devices: None  Discharge Condition:Stable  CODE STATUS: Full  Diet recommendation: Heart Healthy  Brief/Interim Summary: Kristen Bates is a 35 y.o. female with a history of tobacco and marijuana use. Patient presented secondary to intractable nausea and vomiting, and diarrhea with evidence for colitis on CT abdomen/pelvis.  She is empirically started on IV ciprofloxacin and Flagyl and C. difficile testing was negative.  She has undergone colonoscopy on 12/15 with findings of some mild right-sided colitis.  Per GI recommendations she is to remain on her course of ciprofloxacin and Flagyl to complete a total 10-day course and this has been prescribed.  She is stable for discharge and will have follow-up arranged in the outpatient setting in the next couple weeks to review biopsy results.  No other acute events noted throughout the course of the stay.  Discharge Diagnoses:  Principal Problem:   Colitis Active Problems:   Nausea vomiting and diarrhea  Principal discharge diagnosis: Colitis infectious versus inflammatory.  Discharge Instructions  Discharge Instructions     Diet - low sodium heart healthy   Complete by: As directed    Increase activity slowly   Complete by: As directed       Allergies as of 01/17/2021   No Known Allergies      Medication List     STOP taking these medications    azithromycin  250 MG tablet Commonly known as: ZITHROMAX   benzonatate 100 MG capsule Commonly known as: TESSALON   cephALEXin 500 MG capsule Commonly known as: KEFLEX   dextromethorphan-guaiFENesin 30-600 MG 12hr tablet Commonly known as: MUCINEX DM   predniSONE 10 MG tablet Commonly known as: DELTASONE       TAKE these medications    ciprofloxacin 500 MG tablet Commonly known as: Cipro Take 1 tablet (500 mg total) by mouth 2 (two) times daily for 7 days.   ibuprofen 400 MG tablet Commonly known as: ADVIL Take 400 mg by mouth every 6 (six) hours as needed.   levonorgestrel 20 MCG/24HR IUD Commonly known as: MIRENA 1 each by Intrauterine route once.   metroNIDAZOLE 500 MG tablet Commonly known as: Flagyl Take 1 tablet (500 mg total) by mouth 3 (three) times daily for 7 days.   multivitamin with minerals Tabs tablet Take 1 tablet by mouth daily.   naproxen sodium 220 MG tablet Commonly known as: ALEVE Take 220 mg by mouth 2 (two) times daily as needed.   omeprazole 20 MG capsule Commonly known as: PRILOSEC Take 1 capsule (20 mg total) by mouth daily. What changed:  when to take this reasons to take this   ondansetron 4 MG disintegrating tablet Commonly known as: Zofran ODT Take 1 tablet (4 mg total) by mouth every 8 (eight) hours as needed.        Follow-up Information     [facility]. Go in 2 week(s).   Contact information: [address] [place] [phone]  No Known Allergies  Consultations: GI   Procedures/Studies: CT Abdomen Pelvis W Contrast  Result Date: 01/13/2021 CLINICAL DATA:  Abdominal pain, nausea/vomiting/diarrhea, bradycardia EXAM: CT ABDOMEN AND PELVIS WITH CONTRAST TECHNIQUE: Multidetector CT imaging of the abdomen and pelvis was performed using the standard protocol following bolus administration of intravenous contrast. CONTRAST:  16mL OMNIPAQUE IOHEXOL 300 MG/ML   SOLN COMPARISON:  09/26/2018 FINDINGS: Lower chest: Lung bases are clear. Hepatobiliary: Liver is within normal limits. Gallbladder is unremarkable. No intrahepatic or extrahepatic ductal dilatation. Pancreas: Within normal limits. Spleen: Within normal limits. Adrenals/Urinary Tract: Adrenal glands are within normal limits. Kidneys are within normal limits.  No hydronephrosis. Bladder is within normal limits. Stomach/Bowel: Stomach is notable for a tiny hiatal hernia. No evidence of bowel obstruction. Normal appendix (series 2/image 66). Wall thickening involving the ascending and transverse colon (series 2/image 42), suggesting infectious/inflammatory colitis. Cecum and rectosigmoid colon are preserved. Vascular/Lymphatic: No evidence of abdominal aortic aneurysm. No suspicious abdominopelvic lymphadenopathy. Reproductive: Uterus is notable for an IUD in satisfactory position. Bilateral ovaries are within normal limits. Other: No abdominopelvic ascites. Musculoskeletal: Visualized osseous structures are within normal limits. IMPRESSION: Wall thickening involving the ascending and transverse colon, suggesting infectious/inflammatory colitis. Electronically Signed   By: Julian Hy M.D.   On: 01/13/2021 22:30     Discharge Exam: Vitals:   01/17/21 0915 01/17/21 0930  BP: 134/72 (!) 147/80  Pulse: (!) 57 (!) 56  Resp: (!) 21 19  Temp:    SpO2: 99% 100%   Vitals:   01/17/21 0851 01/17/21 0900 01/17/21 0915 01/17/21 0930  BP: 108/65 135/77 134/72 (!) 147/80  Pulse: 66 (!) 50 (!) 57 (!) 56  Resp: 14 12 (!) 21 19  Temp: 98.2 F (36.8 C)     TempSrc:      SpO2: 100% 100% 99% 100%  Weight:      Height:        General: Pt is alert, awake, not in acute distress Cardiovascular: RRR, S1/S2 +, no rubs, no gallops Respiratory: CTA bilaterally, no wheezing, no rhonchi Abdominal: Soft, NT, ND, bowel sounds + Extremities: no edema, no cyanosis    The results of significant diagnostics from  this hospitalization (including imaging, microbiology, ancillary and laboratory) are listed below for reference.     Microbiology: Recent Results (from the past 240 hour(s))  Resp Panel by RT-PCR (Flu A&B, Covid) Nasopharyngeal Swab     Status: None   Collection Time: 01/13/21  7:26 PM   Specimen: Nasopharyngeal Swab; Nasopharyngeal(NP) swabs in vial transport medium  Result Value Ref Range Status   SARS Coronavirus 2 by RT PCR NEGATIVE NEGATIVE Final    Comment: (NOTE) SARS-CoV-2 target nucleic acids are NOT DETECTED.  The SARS-CoV-2 RNA is generally detectable in upper respiratory specimens during the acute phase of infection. The lowest concentration of SARS-CoV-2 viral copies this assay can detect is 138 copies/mL. A negative result does not preclude SARS-Cov-2 infection and should not be used as the sole basis for treatment or other patient management decisions. A negative result may occur with  improper specimen collection/handling, submission of specimen other than nasopharyngeal swab, presence of viral mutation(s) within the areas targeted by this assay, and inadequate number of viral copies(<138 copies/mL). A negative result must be combined with clinical observations, patient history, and epidemiological information. The expected result is Negative.  Fact Sheet for Patients:  EntrepreneurPulse.com.au  Fact Sheet for Healthcare Providers:  IncredibleEmployment.be  This test is no t yet approved or cleared by  the [employer] and  has been authorized for detection and/or diagnosis of SARS-CoV-2 by FDA under an Emergency Use Authorization (EUA). This EUA will remain  in effect (meaning this test can be used) for the duration of the COVID-19 declaration under Section 564(b)(1) of the Act, 21 U.S.C.section 360bbb-3(b)(1), unless the authorization is terminated  or revoked sooner.       Influenza A by PCR NEGATIVE NEGATIVE Final    Influenza B by PCR NEGATIVE NEGATIVE Final    Comment: (NOTE) The Xpert Xpress SARS-CoV-2/FLU/RSV plus assay is intended as an aid in the diagnosis of influenza from Nasopharyngeal swab specimens and should not be used as a sole basis for treatment. Nasal washings and aspirates are unacceptable for Xpert Xpress SARS-CoV-2/FLU/RSV testing.  Fact Sheet for Patients: BloggerCourse.com  Fact Sheet for Healthcare Providers: SeriousBroker.it  This test is not yet approved or cleared by the [place] FDA and has been authorized for detection and/or diagnosis of SARS-CoV-2 by FDA under an Emergency Use Authorization (EUA). This EUA will remain in effect (meaning this test can be used) for the duration of the COVID-19 declaration under Section 564(b)(1) of the Act, 21 U.S.C. section 360bbb-3(b)(1), unless the authorization is terminated or revoked.  Performed at [facility], [address], [place], [place] [address]   C Difficile Quick Screen w PCR reflex     Status: None   Collection Time: 01/14/21  5:20 PM   Specimen: Stool  Result Value Ref Range Status   C Diff antigen NEGATIVE NEGATIVE Final   C Diff toxin NEGATIVE NEGATIVE Final   C Diff interpretation No C. difficile detected.  Final    Comment: Performed at [facility], [address], [place], [place] [address]  Gastrointestinal Panel by PCR , Stool     Status: None   Collection Time: 01/16/21  8:22 AM   Specimen: Stool  Result Value Ref Range Status   Campylobacter species NOT DETECTED NOT DETECTED Final   Plesimonas shigelloides NOT DETECTED NOT DETECTED Final   Salmonella species NOT DETECTED NOT DETECTED Final   Yersinia enterocolitica NOT DETECTED NOT DETECTED Final   Vibrio species NOT DETECTED NOT DETECTED Final   Vibrio cholerae NOT DETECTED NOT DETECTED Final   Enteroaggregative E coli (EAEC) NOT DETECTED NOT DETECTED Final   Enteropathogenic E coli (EPEC) NOT  DETECTED NOT DETECTED Final   Enterotoxigenic E coli (ETEC) NOT DETECTED NOT DETECTED Final   Shiga like toxin producing E coli (STEC) NOT DETECTED NOT DETECTED Final   Shigella/Enteroinvasive E coli (EIEC) NOT DETECTED NOT DETECTED Final   Cryptosporidium NOT DETECTED NOT DETECTED Final   Cyclospora cayetanensis NOT DETECTED NOT DETECTED Final   Entamoeba histolytica NOT DETECTED NOT DETECTED Final   Giardia lamblia NOT DETECTED NOT DETECTED Final   Adenovirus F40/41 NOT DETECTED NOT DETECTED Final   Astrovirus NOT DETECTED NOT DETECTED Final   Norovirus GI/GII NOT DETECTED NOT DETECTED Final   Rotavirus A NOT DETECTED NOT DETECTED Final   Sapovirus (I, II, IV, and V) NOT DETECTED NOT DETECTED Final    Comment: Performed at [facility], [address] [place], [place] [address]     Labs: BNP (last 3 results) No results for input(s): BNP in the last 8760 hours. Basic Metabolic Panel: Recent Labs  Lab 01/13/21 1910 01/14/21 0445 01/15/21 0513 01/17/21 0715  NA 137 137 137 138  K 4.0 3.3* 4.1 3.7  CL 105 108 106 105  CO2 24 24 25  25  GLUCOSE 121* 99 105* 103*  BUN 9 6 5* 5*  CREATININE 0.70 0.62 0.77 0.85  CALCIUM 8.8* 7.9* 8.2* 8.4*  MG  --  1.5*  --  1.6*   Liver Function Tests: Recent Labs  Lab 01/13/21 1910 01/14/21 0445  AST 15 11*  ALT 15 12  ALKPHOS 56 44  BILITOT 1.5* 1.5*  PROT 6.9 5.8*  ALBUMIN 4.2 3.4*   Recent Labs  Lab 01/13/21 1910  LIPASE 39   No results for input(s): AMMONIA in the last 168 hours. CBC: Recent Labs  Lab 01/13/21 1910 01/14/21 0445 01/15/21 0513  WBC 12.3* 12.8* 7.4  NEUTROABS  --  9.4*  --   HGB 16.3* 14.6 14.7  HCT 50.2* 44.9 45.6  MCV 98.4 98.7 98.7  PLT 279 229 195   Cardiac Enzymes: No results for input(s): CKTOTAL, CKMB, CKMBINDEX, TROPONINI in the last 168 hours. BNP: Invalid input(s): POCBNP CBG: No results for input(s): GLUCAP in the last 168 hours. D-Dimer No results for input(s): DDIMER in  the last 72 hours. Hgb A1c No results for input(s): HGBA1C in the last 72 hours. Lipid Profile No results for input(s): CHOL, HDL, LDLCALC, TRIG, CHOLHDL, LDLDIRECT in the last 72 hours. Thyroid function studies No results for input(s): TSH, T4TOTAL, T3FREE, THYROIDAB in the last 72 hours.  Invalid input(s): FREET3 Anemia work up No results for input(s): VITAMINB12, FOLATE, FERRITIN, TIBC, IRON, RETICCTPCT in the last 72 hours. Urinalysis    Component Value Date/Time   COLORURINE YELLOW 01/13/2021 2310   APPEARANCEUR CLEAR 01/13/2021 2310   LABSPEC 1.010 01/13/2021 2310   PHURINE 5.5 01/13/2021 2310   GLUCOSEU NEGATIVE 01/13/2021 2310   HGBUR SMALL (A) 01/13/2021 2310   BILIRUBINUR NEGATIVE 01/13/2021 2310   KETONESUR >80 (A) 01/13/2021 2310   PROTEINUR NEGATIVE 01/13/2021 2310   UROBILINOGEN 0.2 11/29/2011 1054   NITRITE NEGATIVE 01/13/2021 2310   LEUKOCYTESUR NEGATIVE 01/13/2021 2310   Sepsis Labs Invalid input(s): PROCALCITONIN,  WBC,  LACTICIDVEN Microbiology Recent Results (from the past 240 hour(s))  Resp Panel by RT-PCR (Flu A&B, Covid) Nasopharyngeal Swab     Status: None   Collection Time: 01/13/21  7:26 PM   Specimen: Nasopharyngeal Swab; Nasopharyngeal(NP) swabs in vial transport medium  Result Value Ref Range Status   SARS Coronavirus 2 by RT PCR NEGATIVE NEGATIVE Final    Comment: (NOTE) SARS-CoV-2 target nucleic acids are NOT DETECTED.  The SARS-CoV-2 RNA is generally detectable in upper respiratory specimens during the acute phase of infection. The lowest concentration of SARS-CoV-2 viral copies this assay can detect is 138 copies/mL. A negative result does not preclude SARS-Cov-2 infection and should not be used as the sole basis for treatment or other patient management decisions. A negative result may occur with  improper specimen collection/handling, submission of specimen other than nasopharyngeal swab, presence of viral mutation(s) within the areas  targeted by this assay, and inadequate number of viral copies(<138 copies/mL). A negative result must be combined with clinical observations, patient history, and epidemiological information. The expected result is Negative.  Fact Sheet for Patients:  EntrepreneurPulse.com.au  Fact Sheet for Healthcare Providers:  IncredibleEmployment.be  This test is no t yet approved or cleared by the [place] FDA and  has been authorized for detection and/or diagnosis of SARS-CoV-2 by FDA under an Emergency Use Authorization (EUA). This EUA will remain  in effect (meaning this test can be used) for the duration of the COVID-19 declaration under Section 564(b)(1) of the Act, 21 U.S.C.section  360bbb-3(b)(1), unless the authorization is terminated  or revoked sooner.       Influenza A by PCR NEGATIVE NEGATIVE Final   Influenza B by PCR NEGATIVE NEGATIVE Final    Comment: (NOTE) The Xpert Xpress SARS-CoV-2/FLU/RSV plus assay is intended as an aid in the diagnosis of influenza from Nasopharyngeal swab specimens and should not be used as a sole basis for treatment. Nasal washings and aspirates are unacceptable for Xpert Xpress SARS-CoV-2/FLU/RSV testing.  Fact Sheet for Patients: EntrepreneurPulse.com.au  Fact Sheet for Healthcare Providers: IncredibleEmployment.be  This test is not yet approved or cleared by the [place] FDA and has been authorized for detection and/or diagnosis of SARS-CoV-2 by FDA under an Emergency Use Authorization (EUA). This EUA will remain in effect (meaning this test can be used) for the duration of the COVID-19 declaration under Section 564(b)(1) of the Act, 21 U.S.C. section 360bbb-3(b)(1), unless the authorization is terminated or revoked.  Performed at [facility], [address], [place], [place] [address]   C Difficile Quick Screen w PCR reflex     Status: None   Collection Time:  01/14/21  5:20 PM   Specimen: Stool  Result Value Ref Range Status   C Diff antigen NEGATIVE NEGATIVE Final   C Diff toxin NEGATIVE NEGATIVE Final   C Diff interpretation No C. difficile detected.  Final    Comment: Performed at [facility], [address], [place], [place] [address]  Gastrointestinal Panel by PCR , Stool     Status: None   Collection Time: 01/16/21  8:22 AM   Specimen: Stool  Result Value Ref Range Status   Campylobacter species NOT DETECTED NOT DETECTED Final   Plesimonas shigelloides NOT DETECTED NOT DETECTED Final   Salmonella species NOT DETECTED NOT DETECTED Final   Yersinia enterocolitica NOT DETECTED NOT DETECTED Final   Vibrio species NOT DETECTED NOT DETECTED Final   Vibrio cholerae NOT DETECTED NOT DETECTED Final   Enteroaggregative E coli (EAEC) NOT DETECTED NOT DETECTED Final   Enteropathogenic E coli (EPEC) NOT DETECTED NOT DETECTED Final   Enterotoxigenic E coli (ETEC) NOT DETECTED NOT DETECTED Final   Shiga like toxin producing E coli (STEC) NOT DETECTED NOT DETECTED Final   Shigella/Enteroinvasive E coli (EIEC) NOT DETECTED NOT DETECTED Final   Cryptosporidium NOT DETECTED NOT DETECTED Final   Cyclospora cayetanensis NOT DETECTED NOT DETECTED Final   Entamoeba histolytica NOT DETECTED NOT DETECTED Final   Giardia lamblia NOT DETECTED NOT DETECTED Final   Adenovirus F40/41 NOT DETECTED NOT DETECTED Final   Astrovirus NOT DETECTED NOT DETECTED Final   Norovirus GI/GII NOT DETECTED NOT DETECTED Final   Rotavirus A NOT DETECTED NOT DETECTED Final   Sapovirus (I, II, IV, and V) NOT DETECTED NOT DETECTED Final    Comment: Performed at [facility], [address] [place], [place] [address]     Time coordinating discharge: 35 minutes  SIGNED:   Rodena Goldmann, DO [facility] 01/17/2021, 10:50 AM  If 7PM-7AM, please contact night-coverage [email or web address]

## 2021-01-17 NOTE — Telephone Encounter (Signed)
Patient needs hospital follow up for colitis in the new few weeks.

## 2021-01-17 NOTE — Telephone Encounter (Signed)
Let's see if Dr. Marletta Lor ok with using one of his urgent spots in 2-3 weeks. I will forward.

## 2021-01-17 NOTE — Anesthesia Preprocedure Evaluation (Addendum)
Anesthesia Evaluation  Patient identified by MRN, date of birth, ID band Patient awake    Reviewed: Allergy & Precautions, NPO status , Patient's Chart, lab work & pertinent test results  Airway Mallampati: II  TM Distance: >3 FB Neck ROM: Full    Dental  (+) Dental Advisory Given, Chipped, Missing, Poor Dentition   Pulmonary Current Smoker and Patient abstained from smoking.,    Pulmonary exam normal breath sounds clear to auscultation       Cardiovascular Exercise Tolerance: Good negative cardio ROS Normal cardiovascular exam     Neuro/Psych negative neurological ROS  negative psych ROS   GI/Hepatic negative GI ROS, (+)     substance abuse  marijuana use,   Endo/Other  negative endocrine ROS  Renal/GU negative Renal ROS  negative genitourinary   Musculoskeletal negative musculoskeletal ROS (+)   Abdominal   Peds negative pediatric ROS (+)  Hematology negative hematology ROS (+)   Anesthesia Other Findings   Reproductive/Obstetrics negative OB ROS                            Anesthesia Physical Anesthesia Plan  ASA: 2  Anesthesia Plan: General   Post-op Pain Management: Minimal or no pain anticipated   Induction:   PONV Risk Score and Plan: TIVA  Airway Management Planned: Nasal Cannula and Natural Airway  Additional Equipment:   Intra-op Plan:   Post-operative Plan:   Informed Consent: I have reviewed the patients History and Physical, chart, labs and discussed the procedure including the risks, benefits and alternatives for the proposed anesthesia with the patient or authorized representative who has indicated his/her understanding and acceptance.     Dental advisory given  Plan Discussed with: Surgeon and CRNA  Anesthesia Plan Comments:         Anesthesia Quick Evaluation

## 2021-01-17 NOTE — Op Note (Signed)
[facility] Patient Name: Kristen Bates Procedure Date: 01/17/2021 7:55 AM MRN: [id number] Date of Birth: [birth year] Attending MD: Elon Alas. Abbey Chatters DO CSN: [id number] Age: 35 Admit Type: Outpatient Procedure:                Colonoscopy Indications:              Lower abdominal pain, Clinically significant                            diarrhea of unexplained origin, Abnormal CT of the                            GI tract Providers:                Elon Alas. Abbey Chatters, DO, Lambert Mody, Raphael Gibney, Technician Referring MD:              Medicines:                See the Anesthesia note for documentation of the                            administered medications Complications:            No immediate complications. Estimated Blood Loss:     Estimated blood loss was minimal. Procedure:                Pre-Anesthesia Assessment:                           - The anesthesia plan was to use monitored                            anesthesia care (MAC).                           After obtaining informed consent, the colonoscope                            was passed under direct vision. Throughout the                            procedure, the patient's blood pressure, pulse, and                            oxygen saturations were monitored continuously. The                            [id number]-HQ190L ([id number]) scope was introduced through                            the anus and advanced to the the terminal ileum,                            with identification of the appendiceal orifice and  IC valve. The colonoscopy was performed without                            difficulty. The patient tolerated the procedure                            well. The quality of the bowel preparation was                            evaluated using the BBPS [facility] Bowel Preparation                            Scale) with scores of: Right Colon = 3, Transverse                             Colon = 3 and Left Colon = 3 (entire mucosa seen                            well with no residual staining, small fragments of                            stool or opaque liquid). The total BBPS score                            equals 9. Scope In: 8:33:29 AM Scope Out: 8:45:18 AM Total Procedure Duration: 0 hours 11 minutes 49 seconds  Findings:      The perianal and digital rectal examinations were normal.      Non-bleeding internal hemorrhoids were found during endoscopy.      The terminal ileum appeared normal.      Localized mild inflammation characterized by erosions and erythema was       found at the hepatic flexure. Biopsies were taken with a cold forceps       for histology and placed in bottle #2 with transverse biopsies.      Biopsies were taken with a cold forceps in the transverse colon, in the       ascending colon and in the cecum for histology. Impression:               - Non-bleeding internal hemorrhoids.                           - The examined portion of the ileum was normal.                           - Localized mild inflammation was found at the                            hepatic flexure secondary to colitis. Biopsied.                           - Biopsies were taken with a cold forceps for  histology in the transverse colon, in the ascending                            colon and in the cecum. Moderate Sedation:      Per Anesthesia Care Recommendation:           - Return patient to hospital ward for ongoing care.                           - Soft diet.                           - Patient with mild colitis at hepatic flexure.                            Appears to be resolving. Continue on abx for a                            total of 10 days. Await biopsy results. Continue                            supportive care. Okay to DC once able to tolerate                            diet. Procedure Code(s):        --- Professional  ---                           [phone], Colonoscopy, flexible; with biopsy, single                            or multiple Diagnosis Code(s):        --- Professional ---                           K64.8, Other hemorrhoids                           K52.9, Noninfective gastroenteritis and colitis,                            unspecified                           R10.30, Lower abdominal pain, unspecified                           R19.7, Diarrhea, unspecified                           R93.3, Abnormal findings on diagnostic imaging of                            other parts of digestive tract CPT copyright 2019 American Medical Association. All rights reserved. The codes documented in this report are preliminary and upon coder review may  be revised to meet current compliance requirements. Elon Alas. Abbey Chatters,  DO Elon Alas. Abbey Chatters, DO 01/17/2021 8:53:42 AM This report has been signed electronically. Number of Addenda: 0

## 2021-01-17 NOTE — Transfer of Care (Signed)
Immediate Anesthesia Transfer of Care Note  Patient: Kristen Bates  Procedure(s) Performed: COLONOSCOPY WITH PROPOFOL BIOPSY  Patient Location: PACU  Anesthesia Type:General  Level of Consciousness: awake  Airway & Oxygen Therapy: Patient Spontanous Breathing  Post-op Assessment: Report given to RN  Post vital signs: Reviewed  Last Vitals:  Vitals Value Taken Time  BP    Temp    Pulse 66 01/17/21 0851  Resp 14 01/17/21 0851  SpO2 100 % 01/17/21 0851  Vitals shown include unvalidated device data.  Last Pain:  Vitals:   01/17/21 0828  TempSrc:   PainSc: 0-No pain      Patients Stated Pain Goal: (P) 6 (01/17/21 0800)  Complications: No notable events documented.

## 2021-01-17 NOTE — Progress Notes (Signed)
Tap water enema given. Yellow /green water returned .

## 2021-01-18 ENCOUNTER — Encounter (HOSPITAL_COMMUNITY): Payer: Self-pay | Admitting: Internal Medicine

## 2021-01-21 LAB — SURGICAL PATHOLOGY

## 2021-01-21 NOTE — Telephone Encounter (Signed)
Yes okay to use one of my urgent slots.  Thank you

## 2021-02-20 ENCOUNTER — Ambulatory Visit: Payer: Medicaid Other | Admitting: Internal Medicine

## 2021-03-27 ENCOUNTER — Ambulatory Visit (INDEPENDENT_AMBULATORY_CARE_PROVIDER_SITE_OTHER): Payer: No Typology Code available for payment source | Admitting: Internal Medicine

## 2021-03-27 ENCOUNTER — Other Ambulatory Visit: Payer: Self-pay

## 2021-03-27 ENCOUNTER — Encounter: Payer: Self-pay | Admitting: Internal Medicine

## 2021-03-27 ENCOUNTER — Telehealth: Payer: Self-pay | Admitting: *Deleted

## 2021-03-27 ENCOUNTER — Encounter: Payer: Self-pay | Admitting: *Deleted

## 2021-03-27 VITALS — BP 114/67 | HR 82 | Temp 97.7°F | Ht 64.0 in | Wt 249.0 lb

## 2021-03-27 DIAGNOSIS — K219 Gastro-esophageal reflux disease without esophagitis: Secondary | ICD-10-CM

## 2021-03-27 DIAGNOSIS — R197 Diarrhea, unspecified: Secondary | ICD-10-CM

## 2021-03-27 DIAGNOSIS — R1319 Other dysphagia: Secondary | ICD-10-CM

## 2021-03-27 NOTE — Patient Instructions (Signed)
We will schedule you for upper endoscopy with possible esophageal dilation to further evaluate your chronic reflux as well as difficulty swallowing.  Continue to avoid NSAIDs such as aleve as best as you can.  I am Happy to hear that your bowels are moving better.  We will continue to monitor this.  It was nice seeing both of you today.  Dr. Abbey Chatters  At [facility] we value your feedback. You may receive a survey about your visit today. Please share your experience as we strive to create trusting relationships with our patients to provide genuine, compassionate, quality care.  We appreciate your understanding and patience as we review any laboratory studies, imaging, and other diagnostic tests that are ordered as we care for you. Our office policy is 5 business days for review of these results, and any emergent or urgent results are addressed in a timely manner for your best interest. If you do not hear from our office in 1 week, please contact us.   We also encourage the use of MyChart, which contains your medical information for your review as well. If you are not enrolled in this feature, an access code is on this after visit summary for your convenience. Thank you for allowing [place] to be involved in your care.  It was great to see you today!  I hope you have a great rest of your Winter!    Kristen Bates. Abbey Chatters, D.O. Gastroenterology and Hepatology [facility]

## 2021-03-27 NOTE — Telephone Encounter (Signed)
Per [facility] " Prior Authorization is not required at this time. If you have questions, please contact Member Services at the number on the back of the members card."   PA submitted via [employer]. Pending review. Reference Number: [id number]

## 2021-03-27 NOTE — Progress Notes (Signed)
Referring Provider: No ref. provider found Primary Care Physician:  Patient, No Pcp Per (Inactive) Primary GI:  Dr. Abbey Chatters  Chief Complaint  Patient presents with   colitis    Has loose stool but not as bad. Stool is more solid   Abdominal Pain    Mid lower abd. Burning/empty feeling. Left abd (doesn't think it's GI related)    HPI:   Kristen Bates is a 36 y.o. female who presents to clinic today for  hospital follow-up visit.  Admitted to [facility] December 2022 after presenting with abdominal pain nausea/vomiting and diarrhea. CT abd/pelvis with contrast noted wall thickening involving ascending and transverse colon. Similar presentations have occurred on 2 other occasions in 2020 and then [facility] around 2018/2019. CT abd/pelvis with contrast in 2020 with submucosal fatty infiltration throughout colon suggesting chronic/prior inflammation.  C. difficile and GI pathogen panel negative.  Underwent colonoscopy 01/17/2021 which showed mild colitis of the hepatic flexure.  Biopsies showed patchy degenerative glandular changes suggestive of ischemic colitis.  Other random colon biopsies negative.  She was given 10 days of antibiotics and improved.  Today she states she is back down to 1-2 BMs daily which is her baseline.  Continues to have loose bowel movements.  Much improved compared to 10+ bowel movements daily she was having during hospitalization.  Also with chronic GERD.  Previously on omeprazole 20 mg daily but stopped this as her grandmother was diagnosed with Alzheimer's disease and she is concerned about the association with PPIs and Alzheimer's.  Now manage her symptoms with diet control as well as apple cider vinegar.  Does note worsening dysphagia.  Primarily with solids.  Feels as though food will get stuck in her substernal region.  Past Medical History:  Diagnosis Date   Medical history non-contributory     Past Surgical History:  Procedure Laterality Date    BIOPSY  01/17/2021   Procedure: BIOPSY;  Surgeon: Eloise Harman, DO;  Location: [facility];  Service: Endoscopy;;   CESAREAN SECTION     COLONOSCOPY WITH PROPOFOL N/A 01/17/2021   Procedure: COLONOSCOPY WITH PROPOFOL;  Surgeon: Eloise Harman, DO;  Location: [facility];  Service: Endoscopy;  Laterality: N/A;   DENTAL SURGERY      Current Outpatient Medications  Medication Sig Dispense Refill   COLLAGEN PO Take by mouth daily.     ibuprofen (ADVIL) 400 MG tablet Take 400 mg by mouth every 6 (six) hours as needed.     levonorgestrel (MIRENA) 20 MCG/24HR IUD 1 each by Intrauterine route once.     naproxen sodium (ALEVE) 220 MG tablet Take 220 mg by mouth 2 (two) times daily as needed.     ondansetron (ZOFRAN ODT) 4 MG disintegrating tablet Take 1 tablet (4 mg total) by mouth every 8 (eight) hours as needed for nausea or vomiting. 10 tablet 1   Multiple Vitamin (MULTIVITAMIN WITH MINERALS) TABS tablet Take 1 tablet by mouth daily. (Patient not taking: Reported on 03/27/2021)     omeprazole (PRILOSEC) 20 MG capsule Take 1 capsule (20 mg total) by mouth daily. (Patient not taking: Reported on 03/27/2021) 14 capsule 0   No current facility-administered medications for this visit.    Allergies as of 03/27/2021   (No Known Allergies)    Family History  Problem Relation Age of Onset   Heart failure Mother    Colon polyps Mother    Diabetes Father    Colon cancer Neg Hx  no first degree relatives. Notes maternal great aunts with colon cancer   Inflammatory bowel disease Neg Hx     Social History   Socioeconomic History   Marital status: Single    Spouse name: Not on file   Number of children: Not on file   Years of education: Not on file   Highest education level: Not on file  Occupational History   Not on file  Tobacco Use   Smoking status: Every Day    Packs/day: 1.00    Types: Cigarettes   Smokeless tobacco: Never  Substance and Sexual Activity   Alcohol  use: Yes    Comment: 03/27/21-has cut back. Few beers/week   Drug use: Yes    Types: Marijuana    Comment: daily   Sexual activity: Yes    Birth control/protection: I.U.D.  Other Topics Concern   Not on file  Social History Narrative   Not on file   Social Determinants of Health   Financial Resource Strain: Not on file  Food Insecurity: Not on file  Transportation Needs: Not on file  Physical Activity: Not on file  Stress: Not on file  Social Connections: Not on file    Subjective: Review of Systems  Constitutional:  Negative for chills and fever.  HENT:  Negative for congestion and hearing loss.   Eyes:  Negative for blurred vision and double vision.  Respiratory:  Negative for cough and shortness of breath.   Cardiovascular:  Negative for chest pain and palpitations.  Gastrointestinal:  Positive for diarrhea and nausea. Negative for abdominal pain, blood in stool, constipation, heartburn, melena and vomiting.       Dysphagia  Genitourinary:  Negative for dysuria and urgency.  Musculoskeletal:  Negative for joint pain and myalgias.  Skin:  Negative for itching and rash.  Neurological:  Negative for dizziness and headaches.  Psychiatric/Behavioral:  Negative for depression. The patient is not nervous/anxious.     Objective: BP 114/67    Pulse 82    Temp 97.7 F (36.5 C) (Temporal)    Ht 5\' 4"  (1.626 m)    Wt 249 lb (112.9 kg)    BMI 42.74 kg/m  Physical Exam Constitutional:      Appearance: Normal appearance.  HENT:     Head: Normocephalic and atraumatic.  Eyes:     Extraocular Movements: Extraocular movements intact.     Conjunctiva/sclera: Conjunctivae normal.  Cardiovascular:     Rate and Rhythm: Normal rate and regular rhythm.  Pulmonary:     Effort: Pulmonary effort is normal.     Breath sounds: Normal breath sounds.  Abdominal:     General: Bowel sounds are normal.     Palpations: Abdomen is soft.  Musculoskeletal:        General: No swelling. Normal  range of motion.     Cervical back: Normal range of motion and neck supple.  Skin:    General: Skin is warm and dry.     Coloration: Skin is not jaundiced.  Neurological:     General: No focal deficit present.     Mental Status: She is alert and oriented to person, place, and time.  Psychiatric:        Mood and Affect: Mood normal.        Behavior: Behavior normal.     Assessment: *Colitis *Diarrhea-improved *GERD *Dysphagia   Plan: Will schedule for EGD with possible dilation to evaluate for peptic ulcer disease, esophagitis, gastritis, H. Pylori, duodenitis, or other. Will  also evaluate for esophageal stricture, Schatzki's ring, esophageal web or other.   The risks including infection, bleed, or perforation as well as benefits, limitations, alternatives and imponderables have been reviewed with the patient. Potential for esophageal dilation, biopsy, etc. have also been reviewed.  Questions have been answered. All parties agreeable.  Diarrhea improved.  We will continue to monitor.  Counseled to avoid NSAIDs.  Further recommendations to follow.  03/27/2021 2:53 PM   Disclaimer: This note was dictated with voice recognition software. Similar sounding words can inadvertently be transcribed and may not be corrected upon review.

## 2021-03-28 NOTE — Telephone Encounter (Signed)
PA approved. Auth# [id number], DOS 04/16/2021-06/15/2021

## 2021-04-12 ENCOUNTER — Other Ambulatory Visit (HOSPITAL_COMMUNITY)
Admission: RE | Admit: 2021-04-12 | Discharge: 2021-04-12 | Disposition: A | Discharge status: Home / Self Care | Payer: No Typology Code available for payment source | Source: Ambulatory Visit | Attending: Internal Medicine | Admitting: Internal Medicine

## 2021-04-12 DIAGNOSIS — K219 Gastro-esophageal reflux disease without esophagitis: Secondary | ICD-10-CM | POA: Diagnosis present

## 2021-04-12 DIAGNOSIS — R1319 Other dysphagia: Secondary | ICD-10-CM | POA: Insufficient documentation

## 2021-04-12 LAB — PREGNANCY, URINE: Preg Test, Ur: NEGATIVE

## 2021-04-16 ENCOUNTER — Encounter (HOSPITAL_COMMUNITY)
Admission: RE | Disposition: A | Discharge status: Home / Self Care | Payer: Self-pay | Source: Ambulatory Visit | Attending: Internal Medicine

## 2021-04-16 ENCOUNTER — Ambulatory Visit (HOSPITAL_COMMUNITY)
Admission: RE | Admit: 2021-04-16 | Discharge: 2021-04-16 | Disposition: A | Discharge status: Home / Self Care | Payer: No Typology Code available for payment source | Source: Ambulatory Visit | Attending: Internal Medicine | Admitting: Internal Medicine

## 2021-04-16 ENCOUNTER — Ambulatory Visit (HOSPITAL_BASED_OUTPATIENT_CLINIC_OR_DEPARTMENT_OTHER): Payer: No Typology Code available for payment source | Admitting: Certified Registered Nurse Anesthetist

## 2021-04-16 ENCOUNTER — Ambulatory Visit (HOSPITAL_COMMUNITY): Payer: No Typology Code available for payment source | Admitting: Certified Registered Nurse Anesthetist

## 2021-04-16 ENCOUNTER — Other Ambulatory Visit: Payer: Self-pay

## 2021-04-16 ENCOUNTER — Encounter (HOSPITAL_COMMUNITY): Payer: Self-pay

## 2021-04-16 DIAGNOSIS — F17210 Nicotine dependence, cigarettes, uncomplicated: Secondary | ICD-10-CM | POA: Diagnosis not present

## 2021-04-16 DIAGNOSIS — K2970 Gastritis, unspecified, without bleeding: Secondary | ICD-10-CM | POA: Insufficient documentation

## 2021-04-16 DIAGNOSIS — Z6841 Body Mass Index (BMI) 40.0 and over, adult: Secondary | ICD-10-CM

## 2021-04-16 DIAGNOSIS — K319 Disease of stomach and duodenum, unspecified: Secondary | ICD-10-CM | POA: Insufficient documentation

## 2021-04-16 DIAGNOSIS — R1310 Dysphagia, unspecified: Secondary | ICD-10-CM

## 2021-04-16 DIAGNOSIS — K219 Gastro-esophageal reflux disease without esophagitis: Secondary | ICD-10-CM | POA: Diagnosis not present

## 2021-04-16 HISTORY — PX: BALLOON DILATION: SHX5330

## 2021-04-16 HISTORY — PX: ESOPHAGOGASTRODUODENOSCOPY (EGD) WITH PROPOFOL: SHX5813

## 2021-04-16 SURGERY — ESOPHAGOGASTRODUODENOSCOPY (EGD) WITH PROPOFOL
Anesthesia: General

## 2021-04-16 MED ORDER — FAMOTIDINE 40 MG PO TABS: 40.0000 mg | ORAL_TABLET | Freq: Every day | ORAL | 11 refills | Status: AC

## 2021-04-16 MED ORDER — PROPOFOL 10 MG/ML IV BOLUS
INTRAVENOUS | Status: DC | PRN
  Administered 2021-04-16: 30 mg via INTRAVENOUS
  Administered 2021-04-16: 120 mg via INTRAVENOUS
  Administered 2021-04-16: 50 mg via INTRAVENOUS

## 2021-04-16 MED ORDER — LACTATED RINGERS IV SOLN: INTRAVENOUS | Status: DC

## 2021-04-16 MED ORDER — SUCRALFATE 1 G PO TABS: 1.0000 g | ORAL_TABLET | Freq: Four times a day (QID) | ORAL | 1 refills | Status: AC

## 2021-04-16 MED ORDER — ONDANSETRON HCL 4 MG/2ML IJ SOLN
INTRAMUSCULAR | Status: DC | PRN
  Administered 2021-04-16: 4 mg via INTRAVENOUS

## 2021-04-16 MED ORDER — LIDOCAINE HCL (CARDIAC) PF 100 MG/5ML IV SOSY
PREFILLED_SYRINGE | INTRAVENOUS | Status: DC | PRN
  Administered 2021-04-16: 50 mg via INTRAVENOUS

## 2021-04-16 MED ORDER — LACTATED RINGERS IV SOLN
INTRAVENOUS | Status: DC | PRN
Start: 2021-04-16 — End: 2021-04-16

## 2021-04-16 NOTE — Discharge Instructions (Addendum)
EGD ?Discharge instructions ?Please read the instructions outlined below and refer to this sheet in the next few weeks. These discharge instructions provide you with general information on caring for yourself after you leave the hospital. Your doctor may also give you specific instructions. While your treatment has been planned according to the most current medical practices available, unavoidable complications occasionally occur. If you have any problems or questions after discharge, please call your doctor. ?ACTIVITY ?You may resume your regular activity but move at a slower pace for the next 24 hours.  ?Take frequent rest periods for the next 24 hours.  ?Walking will help expel (get rid of) the air and reduce the bloated feeling in your abdomen.  ?No driving for 24 hours (because of the anesthesia (medicine) used during the test).  ?You may shower.  ?Do not sign any important legal documents or operate any machinery for 24 hours (because of the anesthesia used during the test).  ?NUTRITION ?Drink plenty of fluids.  ?You may resume your normal diet.  ?Begin with a light meal and progress to your normal diet.  ?Avoid alcoholic beverages for 24 hours or as instructed by your caregiver.  ?MEDICATIONS ?You may resume your normal medications unless your caregiver tells you otherwise.  ?WHAT YOU CAN EXPECT TODAY ?You may experience abdominal discomfort such as a feeling of fullness or ?gas? pains.  ?FOLLOW-UP ?Your doctor will discuss the results of your test with you.  ?SEEK IMMEDIATE MEDICAL ATTENTION IF ANY OF THE FOLLOWING OCCUR: ?Excessive nausea (feeling sick to your stomach) and/or vomiting.  ?Severe abdominal pain and distention (swelling).  ?Trouble swallowing.  ?Temperature over 101? F (37.8? C).  ?Rectal bleeding or vomiting of blood.  ? ? ?Your EGD revealed mild amount inflammation in your stomach.  I took biopsies of this to rule out infection with a bacteria called H. pylori.  Await pathology results, my  office will contact you.  You had a mild tightening of your esophagus.  I stretched this today.  Hopefully this improves your swallowing.  Follow-up with GI in 4 months. ? ? ? ?OFFICE WILL SEND A LETTER WITH FOLLOW UP APPOINTMENT ? ?I hope you have a great rest of your week! ? ?Elon Alas. Abbey Chatters, D.O. ?Gastroenterology and Hepatology ?[facility] ? ?

## 2021-04-16 NOTE — Interval H&P Note (Signed)
History and Physical Interval Note: ? ?04/16/2021 ?9:39 AM ? ?Kristen Bates  has presented today for surgery, with the diagnosis of gerd, dysphagia.  The various methods of treatment have been discussed with the patient and family. After consideration of risks, benefits and other options for treatment, the patient has consented to  Procedure(s) with comments: ?ESOPHAGOGASTRODUODENOSCOPY (EGD) WITH PROPOFOL (N/A) - 10:30am ?BALLOON DILATION (N/A) as a surgical intervention.  The patient's history has been reviewed, patient examined, no change in status, stable for surgery.  I have reviewed the patient's chart and labs.  Questions were answered to the patient's satisfaction.   ? ? ?Eloise Harman ? ? ?

## 2021-04-16 NOTE — Anesthesia Postprocedure Evaluation (Signed)
Anesthesia Post Note ? ?Patient: Kristen Bates ? ?Procedure(s) Performed: ESOPHAGOGASTRODUODENOSCOPY (EGD) WITH PROPOFOL ?BALLOON DILATION ? ?Patient location during evaluation: Phase II ?Anesthesia Type: General ?Level of consciousness: awake ?Pain management: pain level controlled ?Vital Signs Assessment: post-procedure vital signs reviewed and stable ?Respiratory status: spontaneous breathing and respiratory function stable ?Cardiovascular status: blood pressure returned to baseline and stable ?Postop Assessment: no headache and no apparent nausea or vomiting ?Anesthetic complications: no ?Comments: Late entry ? ? ?No notable events documented. ? ? ?Last Vitals:  ?Vitals:  ? 04/16/21 0918 04/16/21 0957  ?BP: 114/69 (!) 107/58  ?Pulse: 72 70  ?Resp: 15   ?Temp: 36.9 ?C 36.5 ?C  ?SpO2: 98% 99%  ?  ?Last Pain:  ?Vitals:  ? 04/16/21 0957  ?TempSrc: Oral  ?PainSc:   ? ? ?  ?  ?  ?  ?  ?  ? ?Windell Norfolk ? ? ? ? ?

## 2021-04-16 NOTE — Transfer of Care (Signed)
Immediate Anesthesia Transfer of Care Note ? ?Patient: Kristen Bates ? ?Procedure(s) Performed: ESOPHAGOGASTRODUODENOSCOPY (EGD) WITH PROPOFOL ?BALLOON DILATION ? ?Patient Location: Endoscopy Unit ? ?Anesthesia Type:General ? ?Level of Consciousness: awake and alert  ? ?Airway & Oxygen Therapy: Patient Spontanous Breathing ? ?Post-op Assessment: Report given to RN and Post -op Vital signs reviewed and stable ? ?Post vital signs: Reviewed and stable ? ?Last Vitals:  ?Vitals Value Taken Time  ?BP    ?Temp    ?Pulse    ?Resp    ?SpO2 99%   ? ? ?Last Pain:  ?Vitals:  ? 04/16/21 0947  ?TempSrc:   ?PainSc: 0-No pain  ?   ? ?Patients Stated Pain Goal: 5 (04/16/21 3557) ? ?Complications: No notable events documented. ?

## 2021-04-16 NOTE — Op Note (Signed)
[facility] ?Patient Name: Kristen Bates ?Procedure Date: 04/16/2021 9:40 AM ?MRN: [id number] ?Date of Birth: 08-18-1985 ?Attending MD: Elon Alas. Abbey Chatters , DO ?CSN: [id number] ?Age: 36 ?Admit Type: Outpatient ?Procedure:                Upper GI endoscopy ?Indications:              Dysphagia, Heartburn ?Providers:                Elon Alas. Abbey Chatters, DO, Charlsie Quest. Theda Sers RN, Therapist, sports,  ?                          Raphael Gibney, Technician ?Referring MD:              ?Medicines:                See the Anesthesia note for documentation of the  ?                          administered medications ?Complications:            No immediate complications. ?Estimated Blood Loss:     Estimated blood loss was minimal. ?Procedure:                Pre-Anesthesia Assessment: ?                          - The anesthesia plan was to use monitored  ?                          anesthesia care (MAC). ?                          After obtaining informed consent, the endoscope was  ?                          passed under direct vision. Throughout the  ?                          procedure, the patient's blood pressure, pulse, and  ?                          oxygen saturations were monitored continuously. The  ?                          GIF-H190 ([id number]) scope was introduced through the  ?                          mouth, and advanced to the second part of duodenum.  ?                          The upper GI endoscopy was accomplished without  ?                          difficulty. The patient tolerated the procedure  ?                          well. ?Scope In: 9:48:01  AM ?Scope Out: 9:53:43 AM ?Total Procedure Duration: 0 hours 5 minutes 42 seconds  ?Findings: ?     There is no endoscopic evidence of areas of erosion, esophagitis, hiatal  ?     hernia, ulcerations or varices in the entire esophagus. ?     No endoscopic abnormality was evident in the esophagus to explain the  ?     patient's complaint of dysphagia. Preparations were made for empiric  ?      dilation. A TTS dilator was passed through the scope. Dilation with an  ?     18-19-20 mm balloon dilator was performed to 20 mm. Dilation was  ?     performed with a mild resistance at 20 mm. Estimated blood loss was none. ?     Localized mild inflammation characterized by erythema and linear  ?     erosions was found in the gastric antrum. Biopsies were taken with a  ?     cold forceps for Helicobacter pylori testing. ?     The duodenal bulb, first portion of the duodenum and second portion of  ?     the duodenum were normal. ?Impression:               - Gastritis. Biopsied. ?                          - Normal duodenal bulb, first portion of the  ?                          duodenum and second portion of the duodenum. ?Moderate Sedation: ?     Per Anesthesia Care ?Recommendation:           - Patient has a contact number available for  ?                          emergencies. The signs and symptoms of potential  ?                          delayed complications were discussed with the  ?                          patient. Return to normal activities tomorrow.  ?                          Written discharge instructions were provided to the  ?                          patient. ?                          - Resume previous diet. ?                          - Continue present medications. ?                          - Await pathology results. ?                          - Repeat upper endoscopy PRN  for retreatment. ?                          - Use Protonix (pantoprazole) 40 mg PO daily. ?Procedure Code(s):        --- Professional --- ?                          [phone], Esophagogastroduodenoscopy, flexible,  ?                          transoral; with biopsy, single or multiple ?Diagnosis Code(s):        --- Professional --- ?                          K29.70, Gastritis, unspecified, without bleeding ?                          R13.10, Dysphagia, unspecified ?                          R12, Heartburn ?CPT copyright 2019 American  Medical Association. All rights reserved. ?The codes documented in this report are preliminary and upon coder review may  ?be revised to meet current compliance requirements. ?Elon Alas. Abbey Chatters, DO ?Elon Alas. Abbey Chatters, DO ?04/16/2021 10:00:23 AM ?This report has been signed electronically. ?Number of Addenda: 0 ?

## 2021-04-16 NOTE — Anesthesia Preprocedure Evaluation (Signed)
Anesthesia Evaluation  Patient identified by MRN, date of birth, ID band Patient awake    Reviewed: Allergy & Precautions, H&P , NPO status , Patient's Chart, lab work & pertinent test results, reviewed documented beta blocker date and time   Airway Mallampati: II  TM Distance: >3 FB Neck ROM: full    Dental no notable dental hx.    Pulmonary neg pulmonary ROS, Current Smoker,    Pulmonary exam normal breath sounds clear to auscultation       Cardiovascular Exercise Tolerance: Good negative cardio ROS   Rhythm:regular Rate:Normal     Neuro/Psych negative neurological ROS  negative psych ROS   GI/Hepatic negative GI ROS, Neg liver ROS,   Endo/Other  Morbid obesity  Renal/GU negative Renal ROS  negative genitourinary   Musculoskeletal   Abdominal   Peds  Hematology negative hematology ROS (+)   Anesthesia Other Findings   Reproductive/Obstetrics negative OB ROS                             Anesthesia Physical Anesthesia Plan  ASA: 3  Anesthesia Plan: General   Post-op Pain Management:    Induction:   PONV Risk Score and Plan: Propofol infusion  Airway Management Planned:   Additional Equipment:   Intra-op Plan:   Post-operative Plan:   Informed Consent: I have reviewed the patients History and Physical, chart, labs and discussed the procedure including the risks, benefits and alternatives for the proposed anesthesia with the patient or authorized representative who has indicated his/her understanding and acceptance.     Dental Advisory Given  Plan Discussed with: CRNA  Anesthesia Plan Comments:         Anesthesia Quick Evaluation  

## 2021-04-17 LAB — SURGICAL PATHOLOGY

## 2021-04-19 ENCOUNTER — Encounter (HOSPITAL_COMMUNITY): Payer: Self-pay | Admitting: Internal Medicine

## 2021-07-30 ENCOUNTER — Encounter: Payer: Self-pay | Admitting: Internal Medicine

## 2022-07-14 IMAGING — CT CT ABD-PELV W/ CM
2 of 5 series · 17 of 46 positions shown, 19 images · IV contrast (Omnipaque or Isovue)
Comparison: 09/26/2018

CLINICAL DATA: Abdominal pain, nausea/vomiting/diarrhea,
bradycardia

EXAM:
CT ABDOMEN AND PELVIS WITH CONTRAST
TECHNIQUE: Multidetector CT imaging of the abdomen and pelvis was performed
using the standard protocol following bolus administration of
intravenous contrast.
CONTRAST:  100mL OMNIPAQUE IOHEXOL 300 MG/ML  SOLN

[Series 2: axial st · axial · 0.82mm/px · z∈[-334,+121]mm · 14 of 103 slices shown, 16 images]
[im 6/103  soft-tissue]
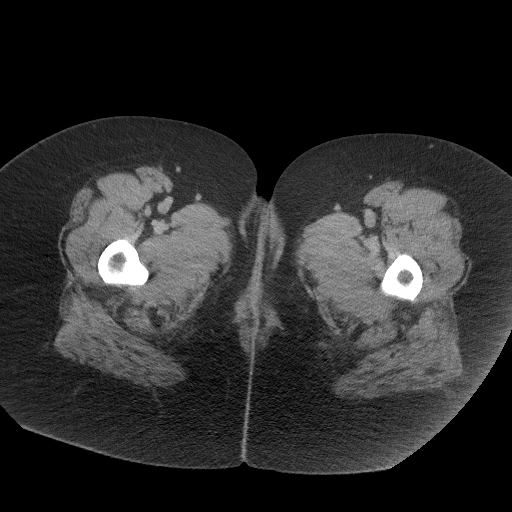
[im 6/103  bone]
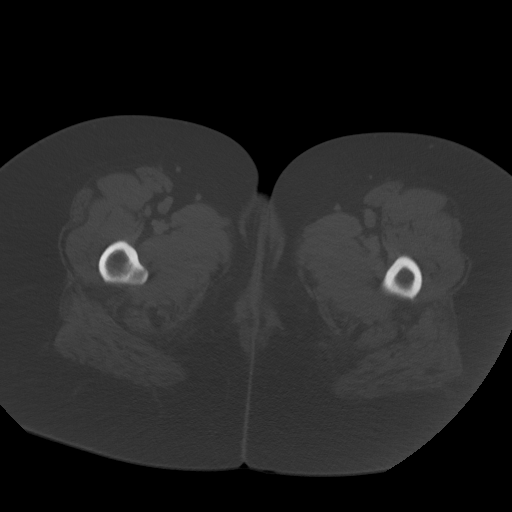
[im 16/103  soft-tissue]
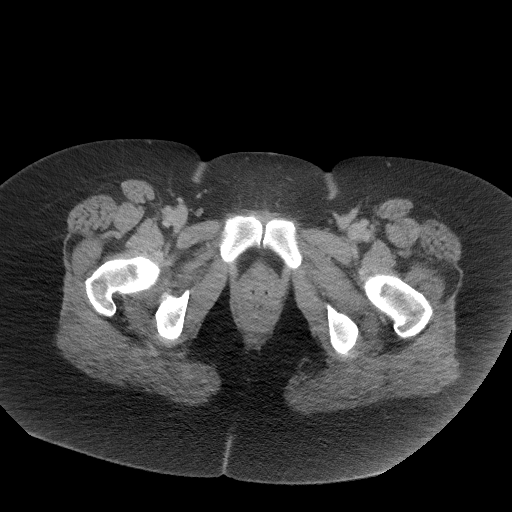
[im 21/103  soft-tissue]
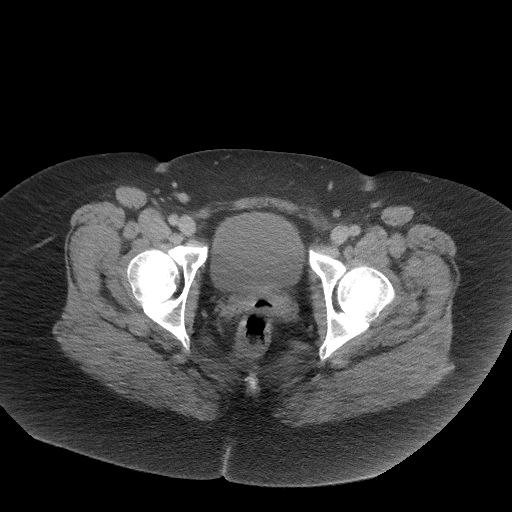
[im 26/103  soft-tissue]
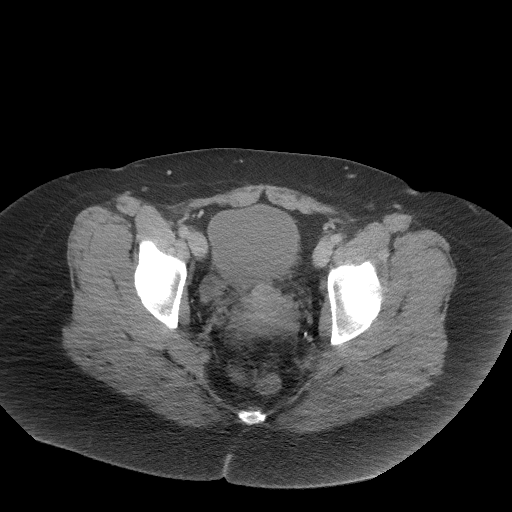
[im 36/103  soft-tissue]
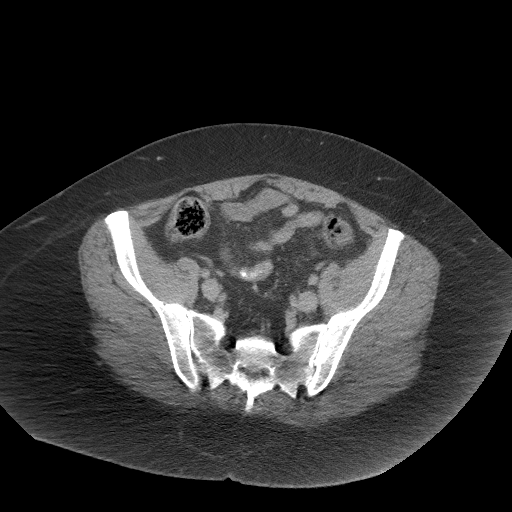
[im 41/103  soft-tissue]
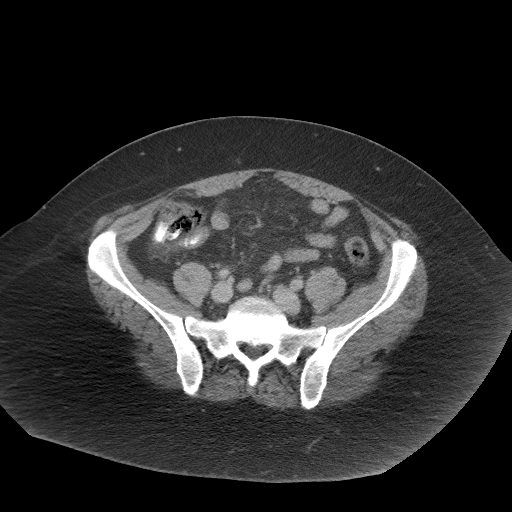
[im 46/103  soft-tissue]
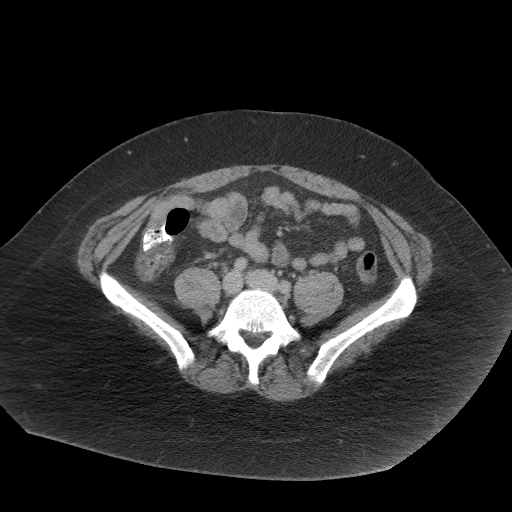
[im 57/103  soft-tissue]
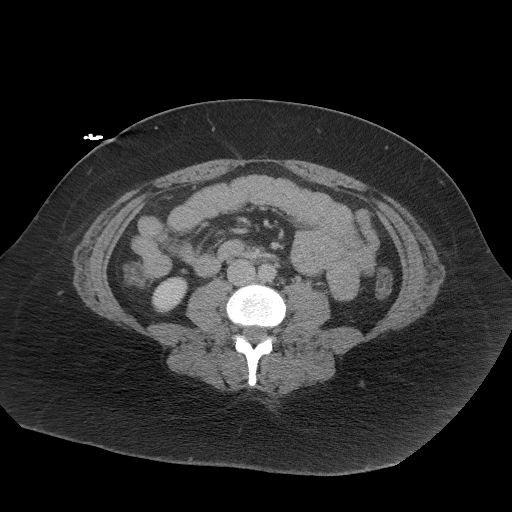
[im 62/103  soft-tissue]
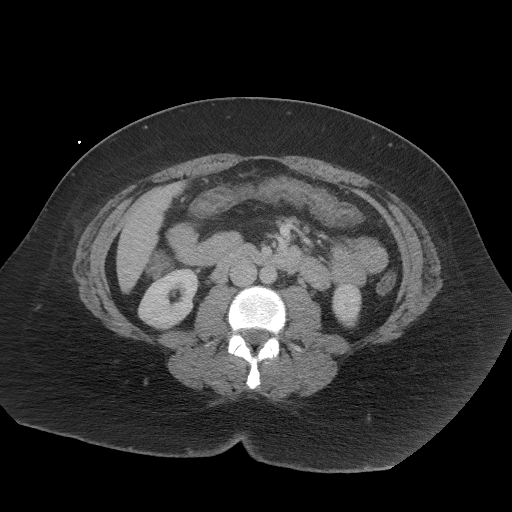
[im 62/103  bone]
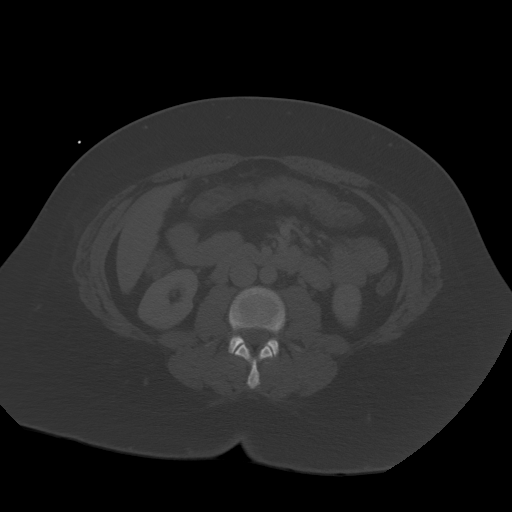
[im 67/103  soft-tissue]
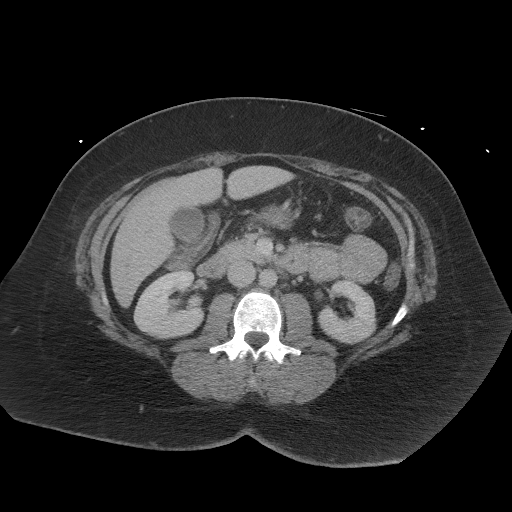
[im 77/103  soft-tissue]
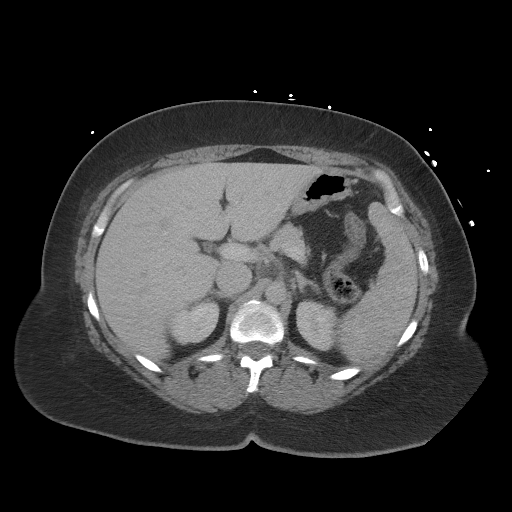
[im 82/103  soft-tissue]
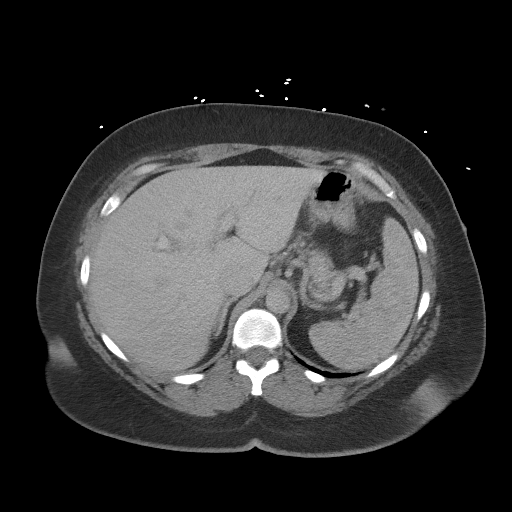
[im 87/103  soft-tissue]
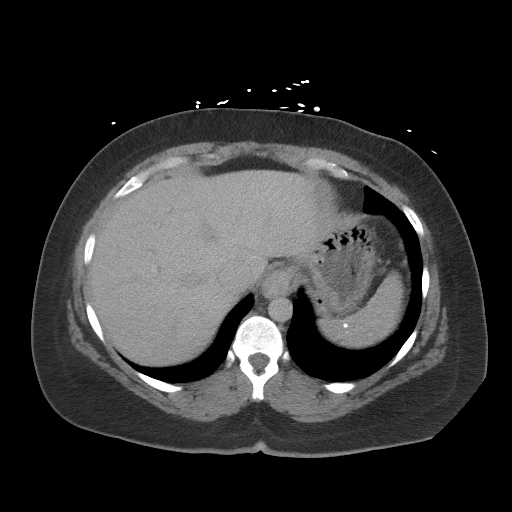
[im 97/103  soft-tissue]
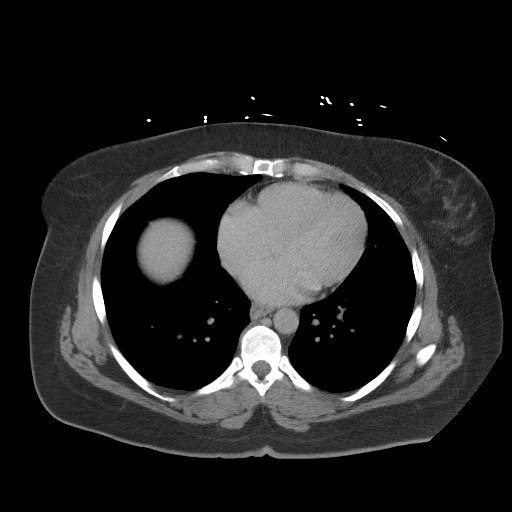

[Series 5: coronal st · coronal · 0.93mm/px · 3 of 132 slices shown]
[im 44/132  soft-tissue]
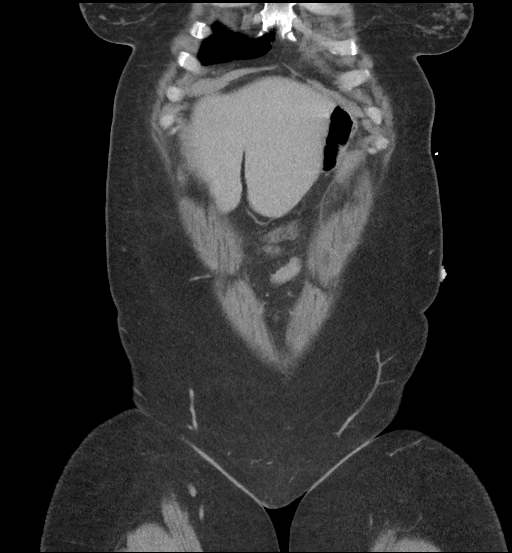
[im 59/132  soft-tissue]
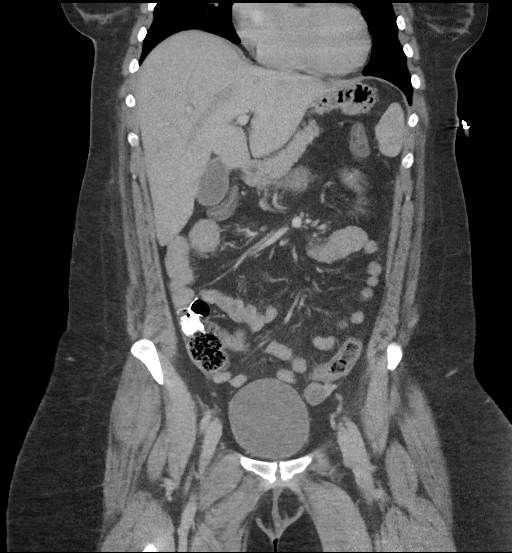
[im 73/132  soft-tissue]
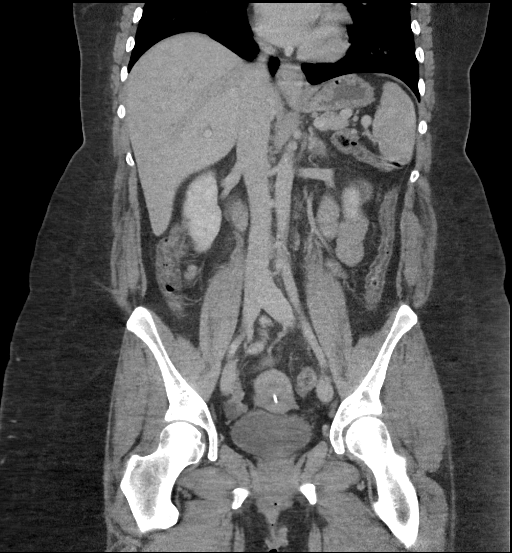

[17 of 46 positions shown; findings below may reference images not displayed]

FINDINGS: Lower chest: Lung bases are clear.

Hepatobiliary: Liver is within normal limits.

Gallbladder is unremarkable. No intrahepatic or extrahepatic ductal
dilatation.

Pancreas: Within normal limits.

Spleen: Within normal limits.

Adrenals/Urinary Tract: Adrenal glands are within normal limits.

Kidneys are within normal limits.  No hydronephrosis.

Bladder is within normal limits.

Stomach/Bowel: Stomach is notable for a tiny hiatal hernia.

No evidence of bowel obstruction.

Normal appendix (series 2/image 66).

Wall thickening involving the ascending and transverse colon (series
2/image 42), suggesting infectious/inflammatory colitis. Cecum and
rectosigmoid colon are preserved.

Vascular/Lymphatic: No evidence of abdominal aortic aneurysm.

No suspicious abdominopelvic lymphadenopathy.

Reproductive: Uterus is notable for an IUD in satisfactory position.

Bilateral ovaries are within normal limits.

Other: No abdominopelvic ascites.

Musculoskeletal: Visualized osseous structures are within normal
limits.
IMPRESSION: Wall thickening involving the ascending and transverse colon,
suggesting infectious/inflammatory colitis.
